# Patient Record
Sex: Male | Born: 1970 | Race: White | Hispanic: No | Marital: Married | Smoking: Never smoker
Health system: Southern US, Community
[De-identification: ages and names within clinical notes are randomized; demographics above are authoritative.]

---

## 1998-09-08 ENCOUNTER — Encounter: Admission: RE | Admit: 1998-09-08 | Discharge: 1998-09-08 | Payer: Self-pay | Admitting: Sports Medicine

## 1998-12-26 ENCOUNTER — Encounter: Admission: RE | Admit: 1998-12-26 | Discharge: 1998-12-26 | Payer: Self-pay | Admitting: Pediatrics

## 2009-10-07 DIAGNOSIS — R61 Generalized hyperhidrosis: Secondary | ICD-10-CM | POA: Insufficient documentation

## 2009-10-07 DIAGNOSIS — R509 Fever, unspecified: Secondary | ICD-10-CM

## 2009-10-17 ENCOUNTER — Encounter: Admission: RE | Admit: 2009-10-17 | Discharge: 2009-10-17 | Payer: Self-pay | Admitting: Internal Medicine

## 2009-10-21 ENCOUNTER — Encounter: Payer: Self-pay | Admitting: Infectious Diseases

## 2009-10-21 ENCOUNTER — Encounter (INDEPENDENT_AMBULATORY_CARE_PROVIDER_SITE_OTHER): Payer: Self-pay | Admitting: *Deleted

## 2009-10-21 DIAGNOSIS — E785 Hyperlipidemia, unspecified: Secondary | ICD-10-CM

## 2009-10-22 ENCOUNTER — Ambulatory Visit: Payer: Self-pay | Admitting: Infectious Diseases

## 2009-10-22 DIAGNOSIS — Z862 Personal history of diseases of the blood and blood-forming organs and certain disorders involving the immune mechanism: Secondary | ICD-10-CM

## 2009-10-22 DIAGNOSIS — Z8639 Personal history of other endocrine, nutritional and metabolic disease: Secondary | ICD-10-CM

## 2009-10-22 LAB — CONVERTED CEMR LAB
ALT: 209 units/L — ABNORMAL HIGH (ref 0–53)
AST: 144 units/L — ABNORMAL HIGH (ref 0–37)
Basophils Absolute: 0.1 10*3/uL (ref 0.0–0.1)
Basophils Relative: 1 % (ref 0–1)
CMV IgM: >240 — ABNORMAL HIGH (ref <30.0)
Calcium: 9.3 mg/dL (ref 8.4–10.5)
Creatinine, Ser: 0.94 mg/dL (ref 0.40–1.50)
Cytomegalovirus Ab-IgG: POSITIVE — AB
Eosinophils Relative: 4 % (ref 0–5)
HCT: 41.3 % (ref 39.0–52.0)
HIV-1 RNA Quant, Log: <1.68 (ref <1.68)
Hemoglobin: 14.5 g/dL (ref 13.0–17.0)
MCV: 95.4 fL (ref >=78.0)
Monocytes Absolute: 0.9 10*3/uL (ref 0.1–1.0)
Monocytes Relative: 13 % — ABNORMAL HIGH (ref 3–12)
Neutro Abs: 1.5 10*3/uL — ABNORMAL LOW (ref 1.7–7.7)
Neutrophils Relative %: 22 % — ABNORMAL LOW (ref 43–77)
Platelets: 172 10*3/uL (ref 150–400)
RDW: 12.9 % (ref 11.5–15.5)
Sed Rate: 14 mm/hr (ref 0–16)
Sodium: 138 meq/L (ref 135–145)
Total Bilirubin: 0.7 mg/dL (ref 0.3–1.2)
Total Protein: 7.3 g/dL (ref 6.0–8.3)
WBC: 6.7 10*3/uL (ref 4.0–10.5)

## 2009-10-23 ENCOUNTER — Telehealth: Payer: Self-pay | Admitting: Infectious Diseases

## 2009-11-06 ENCOUNTER — Ambulatory Visit: Payer: Self-pay | Admitting: Infectious Diseases

## 2009-11-06 LAB — CONVERTED CEMR LAB
Albumin: 4 g/dL (ref 3.5–5.2)
Bilirubin, Direct: 0.1 mg/dL (ref 0.0–0.3)
Indirect Bilirubin: 0.6 mg/dL (ref 0.0–0.9)
Total Bilirubin: 0.7 mg/dL (ref 0.3–1.2)

## 2010-10-27 NOTE — Progress Notes (Signed)
Summary: Lab results  Phone Note Outgoing Call   Summary of Call: Discussed lab results with pt.  Likely CMV mono. He will use symptomatic treatment and f/u in 2 weeks Initial call taken by: Clydie Braun MD,  October 23, 2009 8:25 PM

## 2010-10-27 NOTE — Consult Note (Signed)
Summary: [facility]  [facility]   Imported By: Florinda Marker 10/30/2009 15:21:06  _____________________________________________________________________  External Attachment:    Type:   Image     Comment:   External Document

## 2010-10-27 NOTE — Miscellaneous (Signed)
Summary: HIPAA Restrictions  HIPAA Restrictions   Imported By: Florinda Marker 10/22/2009 14:56:57  _____________________________________________________________________  External Attachment:    Type:   Image     Comment:   External Document

## 2010-10-27 NOTE — Assessment & Plan Note (Signed)
Summary: new pt feversx3wks   Visit Type:  Consult Primary Provider:  [name]  CC:  new patient / fevers x 3 weeks.  History of Present Illness: 41 yo wm with a month of fevers ns and mild fatigue (due to not sleeping).  3.5 wks ago felt poorly - thought it was the flu - took one day off at work (unusual for him) - saw Dr [name] and took zpack with no benefit (10/12/09 finished course). Has had night sweats - soaking and diff sleepig wiht fevers to 99-102 most nights.    Last week fevers have been subsiding.  Also some decrease in NS over last week. Also some sinus sxs but no diarrhea or vomited just mild nausea.  Stools normal.  No rash no joint or muscle aches.  no dysuria.  No abd pain. 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 Has had a runny nose but no other sxs.    Travelled to d/c jan 14th-16. International travel to [place] at a resort in the spring -  Lived in [place] 1998-99 and backpacked [place]. No recent vaccines except flu shot 6 wks ago.       Preventive Screening-Counseling & Management  Alcohol-Tobacco     Alcohol drinks/day: occassionally     Alcohol type: beer and wine     Smoking Status: never  Caffeine-Diet-Exercise     Caffeine use/day: coffee, tea and sodas     Does Patient Exercise: yes     Type of exercise: gym membership     Exercise (avg: min/session): 30-60     Times/week: 3-5  Safety-Violence-Falls     Seat Belt Use: yes   Updated Prior Medication List: CRESTOR 10 MG TABS (ROSUVASTATIN CALCIUM) Take 1 tablet by mouth once a day per PCP MULTIVITAMINS  TABS (MULTIPLE VITAMIN) Take 1 tablet by mouth once a day per PCP FISH OIL 1000 MG CAPS (OMEGA-3 FATTY ACIDS) Take 2 capsules by mouth once a day per PCP D 5000 5000 UNIT TABS (CHOLECALCIFEROL) Take 1 capsule by mouth once a day per PCP  Current Allergies (reviewed today): No known allergies  Past History:  Family History: Last updated: 10/22/2009 [place]  Social History: Last updated:  10/22/2009 no tobacco Occas alcohol Lives with wife and 2 yr old son - son in daycare. not sexually active   Risk Factors: Alcohol Use: occassionally (10/22/2009) Caffeine Use: coffee, tea and sodas (10/22/2009) Exercise: yes (10/22/2009)  Risk Factors: Smoking Status: never (10/22/2009)  Past Medical History: hypercholesterol   Family History: [place]  Social History: no tobacco Occas alcohol Lives with wife and 2 yr old son - son in daycare. not sexually active   Review of Systems       11 systems reviewed and negative except per HPI   Vital Signs:  Patient profile:   40 year old male Height:      75 inches (190.50 cm) Weight:      210.0 pounds (95.45 kg) BMI:     26.34 Temp:     97.6 degrees F (36.44 degrees C) oral Pulse rate:   82 / minute BP sitting:   112 / 72  (left arm)  Vitals Entered By: Baxter Hire) (October 22, 2009 9:33 AM) CC: new patient / fevers x 3 weeks Is Patient Diabetic? No Pain Assessment Patient in pain? no      Nutritional Status BMI of 25 - 29 = overweight Nutritional Status Detail appetite is good per patient  Does patient need  assistance? Functional Status Self care Ambulation Normal   Physical Exam  General:  alert, well-developed, and well-nourished.   Head:  normocephalic and atraumatic.   Eyes:  vision grossly intact and pupils equal.   Ears:  R ear normal and L ear normal.   Nose:  no external deformity.   Mouth:  good dentition.   Neck:  supple.   Lungs:  normal respiratory effort and normal breath sounds.   Heart:  normal rate, regular rhythm, and no murmur.   Abdomen:  soft, non-tender, normal bowel sounds, no distention, no hepatomegaly, and no splenomegaly.   Msk:  normal ROM and no joint tenderness.   Extremities:  no cce Neurologic:  alert & oriented X3, cranial nerves II-XII intact, strength normal in all extremities, and sensation intact to light touch.   Skin:  no rashes.   Cervical Nodes:  no  anterior cervical adenopathy and no posterior cervical adenopathy.   Psych:  Oriented X3, memory intact for recent and remote, and normally interactive.     Impression & Recommendations:  Problem # 1:  FEVER UNSPECIFIED (ICD-780.60) Interesting young man with FUO and elevated LFTs.  I suspect CMV infection given lfts and sxs and his 39 yr old in day care. Check BW below. f.u 2 wks and he will keep a symptom diary ibuprofen prn The following medications were removed from the medication list:    Zithromax 1 Gm Pack (Azithromycin) .Marland Kitchen... Per pcp  Orders: T-CBC w/Diff [id number]) T-CMV IgM  Antibody ([id number]) T-CMV IgG Antibody ([id number]) T-Comprehensive Metabolic Panel ([id number]) T-Culture, Blood Routine ([id number]) T-Culture, Blood Routine ([id number]) T-Hepatitis A Antibody ([id number]) T-Hepatitis C Antibody ([id number]) T-Hepatitis B Core Antibody ([id number]) T-Hepatitis B Surface Antigen (60737-10626) T-HIV Antibody  (Reflex) ([id number]) T-HIV Viral Load [phone]) T-RPR (Syphilis) (93716-96789) T-Sed Rate (Automated) ([id number]) T-CK Total (58527-78242)  Problem # 2:  LIVER FUNCTION TESTS, ABNORMAL, HX OF (ICD-V12.2) will check hep serologies and repeat  Patient Instructions: 1)  Please schedule a follow-up appointment in 2 weeks (can overbook) 2)  Keep a fever and symptom diary. 3)  Take ibuprofen as needed. 4)  Call for new or concerning symptoms. 5)  I will call if your blood tests are very abnormal Process Orders Check Orders Results:     [facility]: ABN not required for this insurance Tests Sent for requisitioning (October 22, 2009 8:49 PM):     10/22/2009: [facility] -- T-CBC w/Diff [[id number]] (signed)     10/22/2009: [facility] -- T-CMV IgM  Antibody [[id number]] (signed)     10/22/2009: [facility] -- T-CMV IgG Antibody [[id number]] (signed)     10/22/2009: [facility] -- T-Comprehensive Metabolic Panel [[id number]] (signed)     10/22/2009: [facility] -- T-Culture, Blood Routine [[id number]] (signed)     10/22/2009: [facility] -- T-Culture, Blood Routine [[id number]] (signed)     10/22/2009: [facility] -- T-Hepatitis A Antibody [[id number]] (signed)     10/22/2009: [facility] -- T-Hepatitis C Antibody [[id number]] (signed)     10/22/2009: [facility] -- T-Hepatitis B Core Antibody [[id number]] (signed)     10/22/2009: [facility] -- T-Hepatitis B Surface Antigen [[id number]] (signed)     10/22/2009: [facility] -- T-HIV Antibody  (Reflex) [[id number]] (signed)     10/22/2009: [facility] -- T-HIV Viral Load [phone] (signed)     10/22/2009: [facility] -- T-RPR (Syphilis) [[id number]] (signed)     10/22/2009: [facility] --  T-Sed Rate (Automated) [[id number]] (signed)     10/22/2009: [facility] -- T-CK Total [[id number]] (signed)

## 2010-10-27 NOTE — Miscellaneous (Signed)
Summary: Problems, Medications and Allergies   Clinical Lists Changes  Problems: Added new problem of NIGHT SWEATS (ICD-780.8) Added new problem of FEVER UNSPECIFIED (ICD-780.60) Added new problem of HYPERLIPIDEMIA (ICD-272.4) Medications: Added new medication of CRESTOR 10 MG TABS (ROSUVASTATIN CALCIUM) Take 1 tablet by mouth once a day per PCP Added new medication of MULTIVITAMINS  TABS (MULTIPLE VITAMIN) Take 1 tablet by mouth once a day per PCP Added new medication of FISH OIL 1000 MG CAPS (OMEGA-3 FATTY ACIDS) Take 2 capsules by mouth once a day per PCP Added new medication of D 5000 5000 UNIT TABS (CHOLECALCIFEROL) Take 1 capsule by mouth once a day per PCP Added new medication of ZITHROMAX 1 GM PACK (AZITHROMYCIN) per PCP Observations: Added new observation of TB PPDRESULT: negative (10/21/2009 13:23) Added new observation of PPD RESULT: < 5mm (10/21/2009 13:23) Added new observation of TB-PPD RDDTE: 10/17/2009 (10/21/2009 13:23) Added new observation of NKA: T (10/21/2009 13:23)      PPD Results    Date of reading: 10/17/2009    Results: < 5mm    Interpretation: negative

## 2010-10-27 NOTE — Assessment & Plan Note (Signed)
Summary: 2WK F/U/VS   Visit Type:  Follow-up Primary Provider:  [name]  CC:  2 week follow up.  History of Present Illness: 40 yo wm with a month of fevers ns and mild fatigue (due to not sleeping).  SEEN 2 wks and bw suggested CMV mono. He is feeling much better now with no further fevers chills ns and back to nml energy level   PRIOR 3.5 wks ago felt poorly - thought it was the flu - took one day off at work (unusual for him) - saw Dr [name] and took zpack with no benefit (10/12/09 finished course). Has had night sweats - soaking and diff sleepig wiht fevers to 99-102 most nights.    Last week fevers have been subsiding.  Also some decrease in NS over last week. Also some sinus sxs but no diarrhea or vomited just mild nausea.  Stools normal.  No rash no joint or muscle aches.  no dysuria.  No abd pain. 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 Has had a runny nose but no other sxs.    Travelled to d/c jan 14th-16. International travel to [place] at a resort in the spring -  Lived in [place] 1998-99 and backpacked [place]. No recent vaccines except flu shot 6 wks ago.       Preventive Screening-Counseling & Management  Alcohol-Tobacco     Alcohol drinks/day: occassionally     Alcohol type: beer and wine     Smoking Status: never  Caffeine-Diet-Exercise     Caffeine use/day: coffee, tea and sodas     Does Patient Exercise: yes     Type of exercise: gym membership     Exercise (avg: min/session): 30-60     Times/week: 3-5  Safety-Violence-Falls     Seat Belt Use: yes   Updated Prior Medication List: CRESTOR 10 MG TABS (ROSUVASTATIN CALCIUM) Take 1 tablet by mouth once a day per PCP MULTIVITAMINS  TABS (MULTIPLE VITAMIN) Take 1 tablet by mouth once a day per PCP FISH OIL 1000 MG CAPS (OMEGA-3 FATTY ACIDS) Take 2 capsules by mouth once a day per PCP D 5000 5000 UNIT TABS (CHOLECALCIFEROL) Take 1 capsule by mouth once a day per PCP  Current Allergies (reviewed  today): No known allergies  Past History:  Past Medical History: Last updated: 10/22/2009 hypercholesterol   Family History: Last updated: 10/22/2009 [place]  Social History: Last updated: 10/22/2009 no tobacco Occas alcohol Lives with wife and 2 yr old son - son in daycare. not sexually active   Risk Factors: Alcohol Use: occassionally (11/06/2009) Caffeine Use: coffee, tea and sodas (11/06/2009) Exercise: yes (11/06/2009)  Risk Factors: Smoking Status: never (11/06/2009)  Review of Systems       11 systems reviewed and negative except per HPI   Vital Signs:  Patient profile:   40 year old male Height:      75 inches (190.50 cm) Weight:      208.5 pounds (94.77 kg) BMI:     26.15 Temp:     97.4 degrees F (36.33 degrees C) oral Pulse rate:   76 / minute BP sitting:   121 / 72  (left arm)  Vitals Entered By: Baxter Hire) (November 06, 2009 9:05 AM) CC: 2 week follow up Is Patient Diabetic? No Pain Assessment Patient in pain? no      Nutritional Status BMI of 25 - 29 = overweight Nutritional Status Detail appetite is good per patient  Does patient need assistance? Functional  Status Self care Ambulation Normal Comments per patient has stopped taking meds at this time.   Physical Exam  General:  alert and well-nourished.   Head:  normocephalic and atraumatic.   Eyes:  vision grossly intact and pupils equal.   Mouth:  fair dentition.   Neck:  supple.   Lungs:  normal respiratory effort and normal breath sounds.   Heart:  normal rate and regular rhythm.   Abdomen:  soft, non-tender, and normal bowel sounds.   Msk:  normal ROM and no joint tenderness.   Extremities:  no cce Neurologic:  alert & oriented X3 and cranial nerves II-XII intact.      Impression & Recommendations:  Problem # 1:  FEVER UNSPECIFIED (ICD-780.60) Interesting young man with FUO and elevated LFTs.  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  CMV IGM was sky high.  He has resolved his illness.   Will repeat LFTS now to confirm resolution of transaminiitis Follow up as needed.  Diagnostics Reviewed:  Hgb: 14.5 (10/22/2009)   HCT: 41.3 (10/22/2009)   Platelets: 172 (10/22/2009) RBC: 4.33 (10/22/2009)   MCV: 95.4 (10/22/2009)   MCHC: 35.1 (10/22/2009) WBC: 6.7 (10/22/2009)   CSF Results Glucose: 85 (10/22/2009) RPR: NON REAC (10/22/2009)     Problem # 2:  LIVER FUNCTION TESTS, ABNORMAL, HX OF (ICD-V12.2)   hep serologies neg except hep a and history of prior immunization  Problem # 3:  NIGHT SWEATS (ICD-780.8) Assessment: Improved  Other Orders: T-Hepatic Function ([id number]) Est. Patient Level IV (09811)  Patient Instructions: 1)  Follow up as needed for recurrent fevers night sweats weight loss or other concerning symptoms.

## 2011-05-02 ENCOUNTER — Emergency Department (HOSPITAL_COMMUNITY)
Admission: EM | Admit: 2011-05-02 | Discharge: 2011-05-02 | Disposition: A | Discharge status: Home / Self Care | Payer: Private Health Insurance - Indemnity | Attending: Emergency Medicine | Admitting: Emergency Medicine

## 2011-05-02 DIAGNOSIS — H11419 Vascular abnormalities of conjunctiva, unspecified eye: Secondary | ICD-10-CM | POA: Insufficient documentation

## 2011-05-02 DIAGNOSIS — E789 Disorder of lipoprotein metabolism, unspecified: Secondary | ICD-10-CM | POA: Insufficient documentation

## 2011-05-02 DIAGNOSIS — H1089 Other conjunctivitis: Secondary | ICD-10-CM | POA: Insufficient documentation

## 2011-05-02 DIAGNOSIS — H5789 Other specified disorders of eye and adnexa: Secondary | ICD-10-CM | POA: Insufficient documentation

## 2014-11-07 ENCOUNTER — Other Ambulatory Visit: Payer: Self-pay | Admitting: Internal Medicine

## 2014-11-07 DIAGNOSIS — IMO0002 Reserved for concepts with insufficient information to code with codable children: Secondary | ICD-10-CM

## 2014-11-07 DIAGNOSIS — R740 Nonspecific elevation of levels of transaminase and lactic acid dehydrogenase [LDH]: Principal | ICD-10-CM

## 2014-11-18 ENCOUNTER — Ambulatory Visit
Admission: RE | Admit: 2014-11-18 | Discharge: 2014-11-18 | Disposition: A | Discharge status: Home / Self Care | Payer: Private Health Insurance - Indemnity | Source: Ambulatory Visit | Attending: Internal Medicine | Admitting: Internal Medicine

## 2014-11-18 DIAGNOSIS — IMO0002 Reserved for concepts with insufficient information to code with codable children: Secondary | ICD-10-CM

## 2014-11-18 DIAGNOSIS — R740 Nonspecific elevation of levels of transaminase and lactic acid dehydrogenase [LDH]: Principal | ICD-10-CM

## 2014-11-19 ENCOUNTER — Encounter: Payer: Self-pay | Admitting: Podiatry

## 2014-11-19 ENCOUNTER — Ambulatory Visit: Payer: Self-pay

## 2014-11-19 ENCOUNTER — Ambulatory Visit (INDEPENDENT_AMBULATORY_CARE_PROVIDER_SITE_OTHER): Payer: Managed Care, Other (non HMO) | Admitting: Podiatry

## 2014-11-19 VITALS — BP 119/82 | HR 63 | Resp 16

## 2014-11-19 DIAGNOSIS — M898X9 Other specified disorders of bone, unspecified site: Secondary | ICD-10-CM

## 2014-11-19 DIAGNOSIS — L84 Corns and callosities: Secondary | ICD-10-CM

## 2014-11-19 DIAGNOSIS — M779 Enthesopathy, unspecified: Secondary | ICD-10-CM

## 2014-11-19 MED ORDER — TRIAMCINOLONE ACETONIDE 10 MG/ML IJ SUSP
10.0000 mg | Freq: Once | INTRAMUSCULAR | Status: AC
  Administered 2014-11-19: 10 mg

## 2014-11-19 NOTE — Progress Notes (Signed)
Subjective:     Patient ID: Arthur Guerrero, male   DOB: 1971/09/21, 44 y.o.   MRN: [id number]  HPI patient presents stating he's been getting some pain in his left big toe of around a month duration. He is due to go on a big hike this summer and is concerned as he increases mileage whether or not this will be a problem   Review of Systems  All other systems reviewed and are negative.      Objective:   Physical Exam  Constitutional: He is oriented to person, place, and time.  Cardiovascular: Intact distal pulses.   Musculoskeletal: Normal range of motion.  Neurological: He is oriented to person, place, and time.  Skin: Skin is warm.  Nursing note and vitals reviewed.  neurovascular status intact with muscle strength adequate and range of motion subtalar midtarsal joint within normal limits. Patient's noted to have deformity of the second toes both feet with medial rotation of the toes and compression between the second toe and big toe of both feet. There is an inflammatory area on the big toe left over right with fluid buildup at the interphalangeal joint left hallux and keratotic lesion formation     Assessment:     Deformed second toe with digital type disease causing inflammation of the hallux left over right and fluid buildup at the interphalangeal joint    Plan:     H&P and x-rays reviewed with patient. Injected the interphalangeal joint left hallux 3 mg Dexon some Kenalog 5 mg Xylocaine and applied padding to reduce pressure. Discussed someday this may require surgical intervention

## 2014-11-19 NOTE — Progress Notes (Signed)
   Subjective:    Patient ID: Arthur Guerrero, male    DOB: 1970/12/03, 44 y.o.   MRN: [id number]  HPI Comments: "I have a place on my toe"  Patient c/o tender 1st toe left for a couple weeks. There is a callused area. The 2nd toe is rubbing against it. No home treatment.  Going on a big hike this summer and wants feet in good shape!  Toe Pain       Review of Systems  All other systems reviewed and are negative.      Objective:   Physical Exam        Assessment & Plan:

## 2015-06-04 ENCOUNTER — Ambulatory Visit: Payer: Managed Care, Other (non HMO) | Admitting: Podiatry

## 2015-06-04 ENCOUNTER — Encounter: Payer: Self-pay | Admitting: Podiatry

## 2015-06-04 ENCOUNTER — Ambulatory Visit (INDEPENDENT_AMBULATORY_CARE_PROVIDER_SITE_OTHER): Payer: Managed Care, Other (non HMO)

## 2015-06-04 VITALS — BP 99/65 | HR 70 | Resp 16

## 2015-06-04 DIAGNOSIS — M79671 Pain in right foot: Secondary | ICD-10-CM

## 2015-06-04 DIAGNOSIS — M79672 Pain in left foot: Principal | ICD-10-CM

## 2015-06-04 NOTE — Progress Notes (Signed)
Subjective:     Patient ID: Arthur Guerrero, male   DOB: 1970/11/29, 44 y.o.   MRN: [id number]  HPI patient states I have a lot of pain on the side of my right foot and also I have pain on top of my left foot. Patient states that he's been very active and walked approximately 100 miles   Review of Systems     Objective:   Physical Exam Neurovascular status intact muscle strength adequate with discomfort around the fifth metatarsal head right with fluid buildup and discomfort in the top of the left foot around the anterior tibial and extensor hallucis longus as it crosses over the ankle joint with no indication of tendon dysfunction    Assessment:     Inflammatory capsulitis right and dorsal tendinitis left    Plan:     Reviewed x-rays and discussed condition. Injected the capsule of the fifth MPJ right 3 mg dexamethasone Kenalog 5 mg Xylocaine and the dorsum of the left foot around the anterior tibial extensor tendon complex 3 mg Kenalog 5 mg Xylocaine

## 2016-05-11 DIAGNOSIS — Z0001 Encounter for general adult medical examination with abnormal findings: Secondary | ICD-10-CM | POA: Diagnosis not present

## 2016-05-11 DIAGNOSIS — Z125 Encounter for screening for malignant neoplasm of prostate: Secondary | ICD-10-CM | POA: Diagnosis not present

## 2016-05-11 DIAGNOSIS — E559 Vitamin D deficiency, unspecified: Secondary | ICD-10-CM | POA: Diagnosis not present

## 2016-05-19 DIAGNOSIS — E785 Hyperlipidemia, unspecified: Secondary | ICD-10-CM | POA: Diagnosis not present

## 2016-05-19 DIAGNOSIS — Z23 Encounter for immunization: Secondary | ICD-10-CM | POA: Diagnosis not present

## 2016-05-19 DIAGNOSIS — Z0001 Encounter for general adult medical examination with abnormal findings: Secondary | ICD-10-CM | POA: Diagnosis not present

## 2016-05-19 DIAGNOSIS — E559 Vitamin D deficiency, unspecified: Secondary | ICD-10-CM | POA: Diagnosis not present

## 2016-06-24 DIAGNOSIS — M67911 Unspecified disorder of synovium and tendon, right shoulder: Secondary | ICD-10-CM | POA: Diagnosis not present

## 2016-07-22 DIAGNOSIS — M67911 Unspecified disorder of synovium and tendon, right shoulder: Secondary | ICD-10-CM | POA: Diagnosis not present

## 2016-07-28 DIAGNOSIS — M25511 Pain in right shoulder: Secondary | ICD-10-CM | POA: Diagnosis not present

## 2016-08-03 DIAGNOSIS — M75111 Incomplete rotator cuff tear or rupture of right shoulder, not specified as traumatic: Secondary | ICD-10-CM | POA: Diagnosis not present

## 2016-08-03 DIAGNOSIS — M67911 Unspecified disorder of synovium and tendon, right shoulder: Secondary | ICD-10-CM | POA: Diagnosis not present

## 2016-09-02 DIAGNOSIS — M67911 Unspecified disorder of synovium and tendon, right shoulder: Secondary | ICD-10-CM | POA: Diagnosis not present

## 2016-11-12 DIAGNOSIS — E559 Vitamin D deficiency, unspecified: Secondary | ICD-10-CM | POA: Diagnosis not present

## 2016-11-12 DIAGNOSIS — E785 Hyperlipidemia, unspecified: Secondary | ICD-10-CM | POA: Diagnosis not present

## 2016-11-19 DIAGNOSIS — E785 Hyperlipidemia, unspecified: Secondary | ICD-10-CM | POA: Diagnosis not present

## 2016-11-19 DIAGNOSIS — Z0001 Encounter for general adult medical examination with abnormal findings: Secondary | ICD-10-CM | POA: Diagnosis not present

## 2016-11-19 DIAGNOSIS — E559 Vitamin D deficiency, unspecified: Secondary | ICD-10-CM | POA: Diagnosis not present

## 2017-03-17 DIAGNOSIS — L237 Allergic contact dermatitis due to plants, except food: Secondary | ICD-10-CM | POA: Diagnosis not present

## 2017-06-22 DIAGNOSIS — Z0001 Encounter for general adult medical examination with abnormal findings: Secondary | ICD-10-CM | POA: Diagnosis not present

## 2017-06-22 DIAGNOSIS — E785 Hyperlipidemia, unspecified: Secondary | ICD-10-CM | POA: Diagnosis not present

## 2017-06-22 DIAGNOSIS — E559 Vitamin D deficiency, unspecified: Secondary | ICD-10-CM | POA: Diagnosis not present

## 2017-06-22 DIAGNOSIS — Z125 Encounter for screening for malignant neoplasm of prostate: Secondary | ICD-10-CM | POA: Diagnosis not present

## 2017-06-30 DIAGNOSIS — Z0000 Encounter for general adult medical examination without abnormal findings: Secondary | ICD-10-CM | POA: Diagnosis not present

## 2017-06-30 DIAGNOSIS — S76012A Strain of muscle, fascia and tendon of left hip, initial encounter: Secondary | ICD-10-CM | POA: Diagnosis not present

## 2017-06-30 DIAGNOSIS — E559 Vitamin D deficiency, unspecified: Secondary | ICD-10-CM | POA: Diagnosis not present

## 2017-06-30 DIAGNOSIS — E7800 Pure hypercholesterolemia, unspecified: Secondary | ICD-10-CM | POA: Diagnosis not present

## 2017-07-05 DIAGNOSIS — M25552 Pain in left hip: Secondary | ICD-10-CM | POA: Diagnosis not present

## 2017-07-06 ENCOUNTER — Ambulatory Visit: Payer: Managed Care, Other (non HMO) | Admitting: Family Medicine

## 2017-11-01 DIAGNOSIS — L128 Other pemphigoid: Secondary | ICD-10-CM | POA: Diagnosis not present

## 2017-11-08 DIAGNOSIS — L128 Other pemphigoid: Secondary | ICD-10-CM | POA: Diagnosis not present

## 2017-11-14 DIAGNOSIS — L128 Other pemphigoid: Secondary | ICD-10-CM | POA: Diagnosis not present

## 2017-12-29 DIAGNOSIS — E7800 Pure hypercholesterolemia, unspecified: Secondary | ICD-10-CM | POA: Diagnosis not present

## 2018-03-18 DIAGNOSIS — M25562 Pain in left knee: Secondary | ICD-10-CM | POA: Diagnosis not present

## 2018-07-03 DIAGNOSIS — Z0000 Encounter for general adult medical examination without abnormal findings: Secondary | ICD-10-CM | POA: Diagnosis not present

## 2018-07-03 DIAGNOSIS — Z125 Encounter for screening for malignant neoplasm of prostate: Secondary | ICD-10-CM | POA: Diagnosis not present

## 2018-07-03 DIAGNOSIS — E559 Vitamin D deficiency, unspecified: Secondary | ICD-10-CM | POA: Diagnosis not present

## 2018-07-06 DIAGNOSIS — Z0000 Encounter for general adult medical examination without abnormal findings: Secondary | ICD-10-CM | POA: Diagnosis not present

## 2018-07-06 DIAGNOSIS — E559 Vitamin D deficiency, unspecified: Secondary | ICD-10-CM | POA: Diagnosis not present

## 2018-07-06 DIAGNOSIS — E785 Hyperlipidemia, unspecified: Secondary | ICD-10-CM | POA: Diagnosis not present

## 2018-07-28 DIAGNOSIS — H35713 Central serous chorioretinopathy, bilateral: Secondary | ICD-10-CM | POA: Diagnosis not present

## 2018-11-30 DIAGNOSIS — J069 Acute upper respiratory infection, unspecified: Secondary | ICD-10-CM | POA: Diagnosis not present

## 2019-07-17 DIAGNOSIS — Z0001 Encounter for general adult medical examination with abnormal findings: Secondary | ICD-10-CM | POA: Diagnosis not present

## 2019-07-20 DIAGNOSIS — E785 Hyperlipidemia, unspecified: Secondary | ICD-10-CM | POA: Diagnosis not present

## 2019-07-20 DIAGNOSIS — Z23 Encounter for immunization: Secondary | ICD-10-CM | POA: Diagnosis not present

## 2019-07-20 DIAGNOSIS — Z0000 Encounter for general adult medical examination without abnormal findings: Secondary | ICD-10-CM | POA: Diagnosis not present

## 2019-07-30 ENCOUNTER — Other Ambulatory Visit: Payer: Self-pay | Admitting: Internal Medicine

## 2019-08-02 DIAGNOSIS — D485 Neoplasm of uncertain behavior of skin: Secondary | ICD-10-CM | POA: Diagnosis not present

## 2019-08-02 DIAGNOSIS — D1801 Hemangioma of skin and subcutaneous tissue: Secondary | ICD-10-CM | POA: Diagnosis not present

## 2019-08-02 DIAGNOSIS — L814 Other melanin hyperpigmentation: Secondary | ICD-10-CM | POA: Diagnosis not present

## 2019-08-02 DIAGNOSIS — D229 Melanocytic nevi, unspecified: Secondary | ICD-10-CM | POA: Diagnosis not present

## 2019-08-02 DIAGNOSIS — L819 Disorder of pigmentation, unspecified: Secondary | ICD-10-CM | POA: Diagnosis not present

## 2019-08-03 DIAGNOSIS — D225 Melanocytic nevi of trunk: Secondary | ICD-10-CM | POA: Diagnosis not present

## 2019-10-04 DIAGNOSIS — H35713 Central serous chorioretinopathy, bilateral: Secondary | ICD-10-CM | POA: Diagnosis not present

## 2020-01-08 DIAGNOSIS — Z0000 Encounter for general adult medical examination without abnormal findings: Secondary | ICD-10-CM | POA: Diagnosis not present

## 2020-01-11 DIAGNOSIS — E785 Hyperlipidemia, unspecified: Secondary | ICD-10-CM | POA: Diagnosis not present

## 2020-03-18 DIAGNOSIS — R1310 Dysphagia, unspecified: Secondary | ICD-10-CM | POA: Diagnosis not present

## 2020-04-26 ENCOUNTER — Encounter (HOSPITAL_COMMUNITY): Payer: Self-pay

## 2020-04-26 ENCOUNTER — Emergency Department (HOSPITAL_COMMUNITY)
Admission: EM | Admit: 2020-04-26 | Discharge: 2020-04-26 | Disposition: A | Discharge status: Home / Self Care | Payer: No Typology Code available for payment source | Attending: Orthopedic Surgery | Admitting: Orthopedic Surgery

## 2020-04-26 ENCOUNTER — Emergency Department (HOSPITAL_COMMUNITY): Payer: No Typology Code available for payment source | Admitting: Anesthesiology

## 2020-04-26 ENCOUNTER — Other Ambulatory Visit: Payer: Self-pay

## 2020-04-26 ENCOUNTER — Emergency Department (HOSPITAL_COMMUNITY): Payer: No Typology Code available for payment source

## 2020-04-26 ENCOUNTER — Encounter (HOSPITAL_COMMUNITY)
Admission: EM | Disposition: A | Discharge status: Home / Self Care | Payer: Self-pay | Source: Home / Self Care | Attending: Emergency Medicine

## 2020-04-26 DIAGNOSIS — E785 Hyperlipidemia, unspecified: Secondary | ICD-10-CM | POA: Diagnosis not present

## 2020-04-26 DIAGNOSIS — Y939 Activity, unspecified: Secondary | ICD-10-CM | POA: Diagnosis not present

## 2020-04-26 DIAGNOSIS — S52572A Other intraarticular fracture of lower end of left radius, initial encounter for closed fracture: Secondary | ICD-10-CM | POA: Diagnosis not present

## 2020-04-26 DIAGNOSIS — Z20822 Contact with and (suspected) exposure to covid-19: Secondary | ICD-10-CM | POA: Diagnosis not present

## 2020-04-26 DIAGNOSIS — Z79899 Other long term (current) drug therapy: Secondary | ICD-10-CM | POA: Diagnosis not present

## 2020-04-26 DIAGNOSIS — G8918 Other acute postprocedural pain: Secondary | ICD-10-CM | POA: Diagnosis not present

## 2020-04-26 DIAGNOSIS — W07XXXA Fall from chair, initial encounter: Secondary | ICD-10-CM | POA: Diagnosis not present

## 2020-04-26 DIAGNOSIS — S6992XA Unspecified injury of left wrist, hand and finger(s), initial encounter: Secondary | ICD-10-CM | POA: Diagnosis present

## 2020-04-26 HISTORY — PX: OPEN REDUCTION INTERNAL FIXATION (ORIF) DISTAL RADIAL FRACTURE: SHX5989

## 2020-04-26 LAB — COMPREHENSIVE METABOLIC PANEL
ALT: 24 U/L (ref 0–44)
AST: 26 U/L (ref 15–41)
Albumin: 3.7 g/dL (ref 3.5–5.0)
Alkaline Phosphatase: 86 U/L (ref 38–126)
Anion gap: 8 (ref 5–15)
BUN: 10 mg/dL (ref 6–20)
CO2: 24 mmol/L (ref 22–32)
Calcium: 9.5 mg/dL (ref 8.9–10.3)
Chloride: 105 mmol/L (ref 98–111)
Creatinine, Ser: 0.92 mg/dL (ref 0.61–1.24)
GFR calc Af Amer: >60 mL/min (ref >60)
GFR calc non Af Amer: >60 mL/min (ref >60)
Glucose, Bld: 108 mg/dL — ABNORMAL HIGH (ref 70–99)
Potassium: 3.7 mmol/L (ref 3.5–5.1)
Sodium: 137 mmol/L (ref 135–145)
Total Bilirubin: 0.6 mg/dL (ref 0.3–1.2)
Total Protein: 7.5 g/dL (ref 6.5–8.1)

## 2020-04-26 LAB — SARS CORONAVIRUS 2 BY RT PCR (HOSPITAL ORDER, PERFORMED IN ~~LOC~~ HOSPITAL LAB): SARS Coronavirus 2: NEGATIVE

## 2020-04-26 LAB — CBC WITH DIFFERENTIAL/PLATELET
Abs Immature Granulocytes: 0.03 10*3/uL (ref 0.00–0.07)
Basophils Absolute: 0 10*3/uL (ref 0.0–0.1)
Basophils Relative: 0 %
Eosinophils Absolute: 0 10*3/uL (ref 0.0–0.5)
Eosinophils Relative: 0 %
HCT: 45.4 % (ref 39.0–52.0)
Hemoglobin: 15 g/dL (ref 13.0–17.0)
Immature Granulocytes: 0 %
Lymphocytes Relative: 9 %
Lymphs Abs: 0.8 10*3/uL (ref 0.7–4.0)
MCH: 30.5 pg (ref 26.0–34.0)
MCHC: 33 g/dL (ref 30.0–36.0)
MCV: 92.5 fL (ref 80.0–100.0)
Monocytes Absolute: 0.4 10*3/uL (ref 0.1–1.0)
Monocytes Relative: 5 %
Neutro Abs: 7 10*3/uL (ref 1.7–7.7)
Neutrophils Relative %: 86 %
Platelets: 198 10*3/uL (ref 150–400)
RBC: 4.91 MIL/uL (ref 4.22–5.81)
RDW: 11.5 % (ref 11.5–15.5)
WBC: 8.2 10*3/uL (ref 4.0–10.5)
nRBC: 0 % (ref 0.0–0.2)

## 2020-04-26 SURGERY — OPEN REDUCTION INTERNAL FIXATION (ORIF) DISTAL RADIUS FRACTURE
Anesthesia: General | Site: Wrist | Laterality: Left

## 2020-04-26 MED ORDER — METHOCARBAMOL 500 MG PO TABS: 500.0000 mg | ORAL_TABLET | Freq: Four times a day (QID) | ORAL | 0 refills | Status: DC

## 2020-04-26 MED ORDER — DEXAMETHASONE SODIUM PHOSPHATE 10 MG/ML IJ SOLN
INTRAMUSCULAR | Status: DC | PRN
  Administered 2020-04-26: 10 mg via INTRAVENOUS

## 2020-04-26 MED ORDER — ACETAMINOPHEN 500 MG PO TABS: 1000.0000 mg | ORAL_TABLET | Freq: Once | ORAL | Status: AC

## 2020-04-26 MED ORDER — MIDAZOLAM HCL 2 MG/2ML IJ SOLN
INTRAMUSCULAR | Status: AC
  Filled 2020-04-26: qty 2

## 2020-04-26 MED ORDER — IBUPROFEN 400 MG PO TABS
400.0000 mg | ORAL_TABLET | Freq: Once | ORAL | Status: AC
  Administered 2020-04-26: 400 mg via ORAL
  Filled 2020-04-26: qty 1

## 2020-04-26 MED ORDER — CEFAZOLIN SODIUM-DEXTROSE 2-4 GM/100ML-% IV SOLN
INTRAVENOUS | Status: AC
  Filled 2020-04-26: qty 100

## 2020-04-26 MED ORDER — ORAL CARE MOUTH RINSE: 15.0000 mL | Freq: Once | OROMUCOSAL | Status: AC

## 2020-04-26 MED ORDER — CLONIDINE HCL (ANALGESIA) 100 MCG/ML EP SOLN
EPIDURAL | Status: DC | PRN
  Administered 2020-04-26: 50 ug

## 2020-04-26 MED ORDER — CEFAZOLIN SODIUM-DEXTROSE 2-3 GM-%(50ML) IV SOLR
INTRAVENOUS | Status: DC | PRN
Start: 2020-04-26 — End: 2020-04-26
  Administered 2020-04-26: 2 g via INTRAVENOUS

## 2020-04-26 MED ORDER — PROPOFOL 10 MG/ML IV BOLUS
INTRAVENOUS | Status: DC | PRN
  Administered 2020-04-26: 200 mg via INTRAVENOUS

## 2020-04-26 MED ORDER — OXYCODONE HCL 5 MG PO TABS: 5.0000 mg | ORAL_TABLET | ORAL | 0 refills | Status: AC | PRN

## 2020-04-26 MED ORDER — LACTATED RINGERS IV SOLN: INTRAVENOUS | Status: DC

## 2020-04-26 MED ORDER — CHLORHEXIDINE GLUCONATE 0.12 % MT SOLN: 15.0000 mL | Freq: Once | OROMUCOSAL | Status: AC

## 2020-04-26 MED ORDER — FENTANYL CITRATE (PF) 100 MCG/2ML IJ SOLN
INTRAMUSCULAR | Status: DC | PRN
  Administered 2020-04-26: 100 ug via INTRAVENOUS

## 2020-04-26 MED ORDER — MIDAZOLAM HCL 5 MG/5ML IJ SOLN
INTRAMUSCULAR | Status: DC | PRN
  Administered 2020-04-26: 2 mg via INTRAVENOUS

## 2020-04-26 MED ORDER — AMISULPRIDE (ANTIEMETIC) 5 MG/2ML IV SOLN: 10.0000 mg | Freq: Once | INTRAVENOUS | Status: DC | PRN

## 2020-04-26 MED ORDER — PROPOFOL 10 MG/ML IV BOLUS
INTRAVENOUS | Status: AC
  Filled 2020-04-26: qty 20

## 2020-04-26 MED ORDER — ROPIVACAINE HCL 5 MG/ML IJ SOLN
INTRAMUSCULAR | Status: DC | PRN
Start: 2020-04-26 — End: 2020-04-26
  Administered 2020-04-26: 30 mL via PERINEURAL

## 2020-04-26 MED ORDER — FENTANYL CITRATE (PF) 100 MCG/2ML IJ SOLN: 25.0000 ug | INTRAMUSCULAR | Status: DC | PRN

## 2020-04-26 MED ORDER — LIDOCAINE 2% (20 MG/ML) 5 ML SYRINGE
INTRAMUSCULAR | Status: DC | PRN
  Administered 2020-04-26: 20 mg via INTRAVENOUS

## 2020-04-26 MED ORDER — CEPHALEXIN 500 MG PO CAPS: 500.0000 mg | ORAL_CAPSULE | Freq: Four times a day (QID) | ORAL | 0 refills | Status: AC

## 2020-04-26 MED ORDER — ACETAMINOPHEN 500 MG PO TABS
ORAL_TABLET | ORAL | Status: AC
  Administered 2020-04-26: 1000 mg via ORAL
  Filled 2020-04-26: qty 2

## 2020-04-26 MED ORDER — ONDANSETRON HCL 4 MG/2ML IJ SOLN
INTRAMUSCULAR | Status: DC | PRN
  Administered 2020-04-26: 4 mg via INTRAVENOUS

## 2020-04-26 MED ORDER — CELECOXIB 200 MG PO CAPS: 200.0000 mg | ORAL_CAPSULE | Freq: Once | ORAL | Status: AC

## 2020-04-26 MED ORDER — ONDANSETRON HCL 4 MG PO TABS: 4.0000 mg | ORAL_TABLET | Freq: Every day | ORAL | 1 refills | Status: AC | PRN

## 2020-04-26 MED ORDER — FENTANYL CITRATE (PF) 250 MCG/5ML IJ SOLN
INTRAMUSCULAR | Status: AC
  Filled 2020-04-26: qty 5

## 2020-04-26 MED ORDER — CHLORHEXIDINE GLUCONATE 0.12 % MT SOLN
OROMUCOSAL | Status: AC
  Administered 2020-04-26: 15 mL via OROMUCOSAL
  Filled 2020-04-26: qty 15

## 2020-04-26 MED ORDER — CELECOXIB 200 MG PO CAPS
ORAL_CAPSULE | ORAL | Status: AC
  Administered 2020-04-26: 200 mg via ORAL
  Filled 2020-04-26: qty 1

## 2020-04-26 MED ORDER — LIDOCAINE-EPINEPHRINE (PF) 2 %-1:200000 IJ SOLN
10.0000 mL | Freq: Once | INTRAMUSCULAR | Status: AC
  Administered 2020-04-26: 10 mL
  Filled 2020-04-26: qty 10

## 2020-04-26 MED ORDER — 0.9 % SODIUM CHLORIDE (POUR BTL) OPTIME
TOPICAL | Status: DC | PRN
  Administered 2020-04-26: 1000 mL

## 2020-04-26 MED ORDER — EPHEDRINE SULFATE 50 MG/ML IJ SOLN
INTRAMUSCULAR | Status: DC | PRN
Start: 2020-04-26 — End: 2020-04-26
  Administered 2020-04-26 (×2): 10 mg via INTRAVENOUS

## 2020-04-26 SURGICAL SUPPLY — 44 items
BIT DRILL 2.2 SS TIBIAL (BIT) ×3 IMPLANT
BNDG ELASTIC 3X5.8 VLCR STR LF (GAUZE/BANDAGES/DRESSINGS) ×3 IMPLANT
BNDG ELASTIC 4X5.8 VLCR STR LF (GAUZE/BANDAGES/DRESSINGS) ×3 IMPLANT
BNDG ESMARK 4X9 LF (GAUZE/BANDAGES/DRESSINGS) ×3 IMPLANT
BNDG GAUZE ELAST 4 BULKY (GAUZE/BANDAGES/DRESSINGS) ×3 IMPLANT
CORD BIPOLAR FORCEPS 12FT (ELECTRODE) ×3 IMPLANT
COVER SURGICAL LIGHT HANDLE (MISCELLANEOUS) ×3 IMPLANT
CUFF TOURN SGL QUICK 18X4 (TOURNIQUET CUFF) ×3 IMPLANT
DRAPE U-SHAPE 47X51 STRL (DRAPES) ×3 IMPLANT
DRSG ADAPTIC 3X8 NADH LF (GAUZE/BANDAGES/DRESSINGS) ×3 IMPLANT
GAUZE SPONGE 4X4 12PLY STRL (GAUZE/BANDAGES/DRESSINGS) ×3 IMPLANT
GAUZE XEROFORM 5X9 LF (GAUZE/BANDAGES/DRESSINGS) ×3 IMPLANT
GLOVE BIOGEL M 8.0 STRL (GLOVE) ×6 IMPLANT
GLOVE SS BIOGEL STRL SZ 8 (GLOVE) ×2 IMPLANT
GLOVE SUPERSENSE BIOGEL SZ 8 (GLOVE) ×4
GOWN STRL REUS W/ TWL LRG LVL3 (GOWN DISPOSABLE) ×1 IMPLANT
GOWN STRL REUS W/ TWL XL LVL3 (GOWN DISPOSABLE) ×1 IMPLANT
GOWN STRL REUS W/TWL LRG LVL3 (GOWN DISPOSABLE) ×3
GOWN STRL REUS W/TWL XL LVL3 (GOWN DISPOSABLE) ×3
KIT BASIN OR (CUSTOM PROCEDURE TRAY) ×3 IMPLANT
KIT TURNOVER KIT B (KITS) ×3 IMPLANT
NS IRRIG 1000ML POUR BTL (IV SOLUTION) ×3 IMPLANT
PACK ORTHO EXTREMITY (CUSTOM PROCEDURE TRAY) ×3 IMPLANT
PAD ARMBOARD 7.5X6 YLW CONV (MISCELLANEOUS) ×6 IMPLANT
PAD CAST 4YDX4 CTTN HI CHSV (CAST SUPPLIES) ×3 IMPLANT
PADDING CAST COTTON 4X4 STRL (CAST SUPPLIES) ×9
PEG LOCKING SMOOTH 2.2X20 (Screw) ×3 IMPLANT
PEG LOCKING SMOOTH 2.2X22 (Screw) ×9 IMPLANT
PEG LOCKING SMOOTH 2.2X24 (Peg) ×12 IMPLANT
PLATE STD DVR LEFT (Plate) ×3 IMPLANT
PLATE STD DVR LT 24X55 (Plate) ×1 IMPLANT
SCREW LOCK 16X2.7X 3 LD TPR (Screw) ×3 IMPLANT
SCREW LOCKING 2.7X15MM (Screw) ×6 IMPLANT
SCREW LOCKING 2.7X16 (Screw) ×9 IMPLANT
SOL PREP POV-IOD 4OZ 10% (MISCELLANEOUS) ×6 IMPLANT
SPLINT FIBERGLASS 3X12 (CAST SUPPLIES) ×3 IMPLANT
SPONGE LAP 4X18 RFD (DISPOSABLE) ×3 IMPLANT
SUT PROLENE 4 0 PS 2 18 (SUTURE) ×6 IMPLANT
SUT VIC AB 3-0 FS2 27 (SUTURE) ×3 IMPLANT
TOWEL GREEN STERILE (TOWEL DISPOSABLE) ×3 IMPLANT
TOWEL GREEN STERILE FF (TOWEL DISPOSABLE) ×3 IMPLANT
TUBE CONNECTING 12'X1/4 (SUCTIONS) ×1
TUBE CONNECTING 12X1/4 (SUCTIONS) ×2 IMPLANT
UNDERPAD 30X36 HEAVY ABSORB (UNDERPADS AND DIAPERS) ×3 IMPLANT

## 2020-04-26 NOTE — ED Provider Notes (Signed)
[facility] EMERGENCY DEPARTMENT Provider Note   CSN: [id number] Arrival date & time: 04/26/20  0938     History No chief complaint on file.   SINAI MAHANY is a 48 y.o. male.  Patient is a 49 year old male with a history of hyperlipidemia presenting today with a complaint of left arm pain and deformity.  Patient was on a chair changing a light bulb when he lost his balance and fell on an outstretched hand.  He denies hitting his head, loss of consciousness or injury elsewhere.  He does not take anticoagulation.  However he reports when he got here he started feeling a little bit lightheaded and dizzy which is now resolved.  He denies any numbness or tingling of his fingers and reports he can move all the fingers on his left hand.  He has no elbow or shoulder pain.  He reports right now his pain is minimal as long as he does not move it.  The history is provided by the patient.       No past medical history on file.  Patient Active Problem List   Diagnosis Date Noted  . LIVER FUNCTION TESTS, ABNORMAL, HX OF 10/22/2009  . HYPERLIPIDEMIA 10/21/2009  . FEVER UNSPECIFIED 10/07/2009  . NIGHT SWEATS 10/07/2009    No past surgical history on file.     No family history on file.  Social History   Tobacco Use  . Smoking status: Never Smoker  Substance Use Topics  . Alcohol use: No    Alcohol/week: 0.0 standard drinks  . Drug use: Not on file    Home Medications Prior to Admission medications   Medication Sig Start Date End Date Taking? Authorizing Provider  Atorvastatin Calcium (LIPITOR PO) Take by mouth.    [provider]  Cholecalciferol (VITAMIN D PO) Take by mouth.    [provider]  Multiple Vitamin (MULTIVITAMIN) capsule Take 1 capsule by mouth daily.    [provider]  Omega-3 Fatty Acids (FISH OIL PO) Take by mouth.    [provider]    Allergies    Patient has no known allergies.  Review of Systems    Review of Systems  All other systems reviewed and are negative.   Physical Exam Updated Vital Signs BP (!) 77/44 (BP Location: Right Arm)   Pulse 45   Temp 98 F (36.7 C) (Oral)   Resp 18   Ht 6' (1.829 m)   Wt 90.7 kg   SpO2 100%   BMI 27.12 kg/m   Physical Exam Vitals and nursing note reviewed.  Constitutional:      General: He is not in acute distress.    Appearance: Normal appearance. He is normal weight.  HENT:     Head: Normocephalic.  Eyes:     Extraocular Movements: Extraocular movements intact.     Pupils: Pupils are equal, round, and reactive to light.  Cardiovascular:     Rate and Rhythm: Normal rate.     Pulses: Normal pulses.  Pulmonary:     Effort: Pulmonary effort is normal. No respiratory distress.  Abdominal:     General: Abdomen is flat.  Musculoskeletal:        General: Tenderness and signs of injury present.     Comments: Deformity of the left wrist with 1+ radial pulse.  Able to wiggle all 5 fingers and sensation intact.  Left elbow and shoulder are within normal limits  Skin:    General: Skin is  warm and dry.  Neurological:     General: No focal deficit present.     Mental Status: He is alert. Mental status is at baseline.  Psychiatric:        Mood and Affect: Mood normal.        Behavior: Behavior normal.     ED Results / Procedures / Treatments   Labs (all labs ordered are listed, but only abnormal results are displayed) Labs Reviewed - No data to display  EKG None  Radiology DG Wrist Complete Left  Result Date: 04/26/2020 CLINICAL DATA:  49 year old male with left wrist pain after falling off a chair and landing on his wrist EXAM: LEFT WRIST - COMPLETE 3+ VIEW COMPARISON:  None. FINDINGS: Comminuted, impacted and significantly dorsally displaced distal radius fracture. The radiocarpal joint remains congruent. The carpus is displaced posteriorly along with the distal radius fracture fragment. No definitive intra-articular  extension. The ulna appears intact. IMPRESSION: Acute comminuted, mildly impacted and significantly dorsally displaced distal radius fracture. Electronically Signed   By: Malachy Moan M.D.   On: 04/26/2020 10:16    Procedures Procedures (including critical care time)  Medications Ordered in ED Medications  lidocaine-EPINEPHrine (XYLOCAINE W/EPI) 2 %-1:200000 (PF) injection 10 mL (has no administration in time range)  ibuprofen (ADVIL) tablet 400 mg (has no administration in time range)    ED Course  I have reviewed the triage vital signs and the nursing notes.  Pertinent labs & imaging results that were available during my care of the patient were reviewed by me and considered in my medical decision making (see chart for details).    MDM Rules/Calculators/A&P                          Patient presenting after trauma and injury to the left wrist.  Based on physical evaluation concern for both bone forearm fracture and displacement.  He is currently neurovascularly intact.  Upon arrival to the emergency room patient was hypotensive and bradycardic feel that he was having near vagal syncope which is now resolved.  Now heart rate and blood pressure are normal.  Patient states the pain is not severe unless he tries to move and is requesting an ibuprofen.  Will hematoma block, get images and discuss with hand surgery.  10:59 AM Patient with an acute comminuted mildly impacted and significantly dorsally displaced distal radius fracture.  After hematoma block patient reports the pain is significantly improved.  N.p.o. time is 6 AM this morning he had a bowl of oatmeal and black coffee.  Spoke with Dr. Amanda Pea who is coming to evaluate the patient requested Covid swab as he would like to take him to the OR today.  Findings discussed with the patient and plan.  MDM Number of Diagnoses or Management Options   Amount and/or Complexity of Data Reviewed Tests in the radiology section of CPT:  ordered and reviewed Independent visualization of images, tracings, or specimens: yes  Risk of Complications, Morbidity, and/or Mortality Presenting problems: moderate Diagnostic procedures: low Management options: low  Patient Progress Patient progress: stable   Final Clinical Impression(s) / ED Diagnoses Final diagnoses:  Other closed intra-articular fracture of distal end of left radius, initial encounter    Rx / DC Orders ED Discharge Orders    None       Gwyneth Sprout, MD 04/26/20 1110

## 2020-04-26 NOTE — Anesthesia Procedure Notes (Signed)
Anesthesia Regional Block: Supraclavicular block   Pre-Anesthetic Checklist: ,, timeout performed, Correct Patient, Correct Site, Correct Laterality, Correct Procedure, Correct Position, site marked, Risks and benefits discussed,  Surgical consent,  Pre-op evaluation,  At surgeon's request and post-op pain management  Laterality: Left  Prep: chloraprep       Needles:  Injection technique: Single-shot  Needle Type: Echogenic Stimulator Needle     Needle Length: 9cm  Needle Gauge: 21     Additional Needles:   Procedures:, nerve stimulator,,, ultrasound used (permanent image in chart),,,,   Nerve Stimulator or Paresthesia:  Response: biceps and finger responses, 0.5 mA,   Additional Responses:   Narrative:  Start time: 04/26/2020 1:22 PM End time: 04/26/2020 1:29 PM Injection made incrementally with aspirations every 5 mL.  Performed by: Personally  Anesthesiologist: Marcene Duos, MD

## 2020-04-26 NOTE — Transfer of Care (Signed)
Immediate Anesthesia Transfer of Care Note  Patient: Arthur Guerrero  Procedure(s) Performed: OPEN REDUCTION INTERNAL FIXATION (ORIF) DISTAL RADIAL FRACTURE (Left Wrist)  Patient Location: PACU  Anesthesia Type:General  Level of Consciousness: awake, alert  and oriented  Airway & Oxygen Therapy: Patient Spontanous Breathing and Patient connected to face mask oxygen  Post-op Assessment: Report given to RN and Post -op Vital signs reviewed and stable  Post vital signs: Reviewed and stable  Last Vitals:  Vitals Value Taken Time  BP 122/75 04/26/20 1510  Temp    Pulse 78 04/26/20 1511  Resp 18 04/26/20 1511  SpO2 100 % 04/26/20 1511  Vitals shown include unvalidated device data.  Last Pain:  Vitals:   04/26/20 1233  TempSrc: Oral  PainSc: 2          Complications: No complications documented.

## 2020-04-26 NOTE — Discharge Instructions (Signed)

## 2020-04-26 NOTE — Anesthesia Postprocedure Evaluation (Signed)
Anesthesia Post Note  Patient: Arthur Guerrero  Procedure(s) Performed: OPEN REDUCTION INTERNAL FIXATION (ORIF) DISTAL RADIAL FRACTURE (Left Wrist)     Patient location during evaluation: PACU Anesthesia Type: General Level of consciousness: awake and alert Pain management: pain level controlled Vital Signs Assessment: post-procedure vital signs reviewed and stable Respiratory status: spontaneous breathing, nonlabored ventilation, respiratory function stable and patient connected to nasal cannula oxygen Cardiovascular status: blood pressure returned to baseline and stable Postop Assessment: no apparent nausea or vomiting Anesthetic complications: no   No complications documented.  Last Vitals:  Vitals:   04/26/20 1513 04/26/20 1527  BP: 122/75 (!) 131/77  Pulse: 84 80  Resp: 16 19  Temp: 36.7 C   SpO2:  95%    Last Pain:  Vitals:   04/26/20 1513  TempSrc:   PainSc: 0-No pain                 Kennieth Rad

## 2020-04-26 NOTE — Anesthesia Preprocedure Evaluation (Signed)
Anesthesia Evaluation  Patient identified by MRN, date of birth, ID band Patient awake    Reviewed: Allergy & Precautions, NPO status , Patient's Chart, lab work & pertinent test results  Airway Mallampati: II  TM Distance: >3 FB Neck ROM: Full    Dental  (+) Dental Advisory Given   Pulmonary neg pulmonary ROS,    breath sounds clear to auscultation       Cardiovascular negative cardio ROS   Rhythm:Regular Rate:Normal     Neuro/Psych negative neurological ROS     GI/Hepatic negative GI ROS, Neg liver ROS,   Endo/Other  negative endocrine ROS  Renal/GU negative Renal ROS     Musculoskeletal   Abdominal   Peds  Hematology negative hematology ROS (+)   Anesthesia Other Findings   Reproductive/Obstetrics                             Anesthesia Physical Anesthesia Plan  ASA: I  Anesthesia Plan: General   Post-op Pain Management:  Regional for Post-op pain   Induction: Intravenous  PONV Risk Score and Plan: 2 and Dexamethasone, Ondansetron and Treatment may vary due to age or medical condition  Airway Management Planned: LMA  Additional Equipment: None  Intra-op Plan:   Post-operative Plan: Extubation in OR  Informed Consent: I have reviewed the patients History and Physical, chart, labs and discussed the procedure including the risks, benefits and alternatives for the proposed anesthesia with the patient or authorized representative who has indicated his/her understanding and acceptance.     Dental advisory given  Plan Discussed with: CRNA  Anesthesia Plan Comments:         Anesthesia Quick Evaluation

## 2020-04-26 NOTE — Op Note (Signed)
Operative note April 26, 2020  Arthur Severin MD  Preoperative diagnosis: Left distal radius fracture comminuted complex greater than 3 part intra-articular  Postop diagnosis: Same  Procedure: #1 left radius open reduction internal fixation comminuted complex distal radius fracture with DVR [employer] cross lock plate and screw construct.  This was a greater than 3 part intra-articular fracture.  #2 AP lateral and oblique x-rays performed examined and interpreted by myself  Romyn Boswell MD  Anesthesia:  Estimated blood loss minimal  Complications none immediate  Operative indications the patient presents for evaluation and surgical care.  Patient understands risk benefits and desires to proceed.  We have discussed with the patient all issues plans and concerns with this in mind we will proceed accordingly. We are planning surgery for your upper extremity. The risk and benefits of surgery to include risk of bleeding, infection, anesthesia,  damage to normal structures and failure of the surgery to accomplish its intended goals of relieving symptoms and restoring function have been discussed in detail. With this in mind we plan to proceed. I have specifically discussed with the patient the pre-and postoperative regime and the dos and don'ts and risk and benefits in great detail. Risk and benefits of surgery also include risk of dystrophy(CRPS), chronic nerve pain, failure of the healing process to go onto completion and other inherent risks of surgery The relavent the pathophysiology of the disease/injury process, as well as the alternatives for treatment and postoperative course of action has been discussed in great detail with the patient who desires to proceed.  We will do everything in our power to help you (the patient) restore function to the upper extremity. It is a pleasure to see this patient today.    Operative procedure: Patient was seen by myself and anesthesia.  Appropriate anesthesia was  induced and following this the patient was prepped with a Hibiclens pre-scrub followed by 10-minute surgical Betadine scrub and paint.  Once this was completed the extremity was elevated and the tourniquet was insufflated to 250 mmHg.  Timeout was observed preoperative antibiotics were given and the patient then underwent a very careful and cautious approach to the extremity with volar radial incision under 250 mm tourniquet control.  FCR tendon sheath was identified and dissected.  There were no complicating features.  Once this was completed the carpal canal contents were retracted ulnarly and the FCR was retracted radially.  We took very meticulous care of the radial artery and the carpal canal contents during the approach.  The pronator was accessed incised and lifted off of the fracture.  The fracture was then reassembled with standard orthopedic equipment and a DVR plate and screw construct from [employer] was accomplished in terms of placement and fixation of the fracture. I did utilize a volar rim plate.  There were no complications and the patient's anatomy was recreated quite nicely. Adequate radial height, volar tilt and radial inclination was restored.  The distal radial ulnar joint, radiocarpal and midcarpal joints all were  stable and satisfactory.  We irrigated copiously and closed the pronator with 3-0 Vicryl followed by closure of the skin edge with Prolene.  Once again, the distal radius underwent open reduction internal fixation without complications.  The distal radial ulnar joint was stable.  The patient had no complications.  All radiographic parameters look quite well following the fixation.  Standard dressing of Adaptic Xeroform 4 x 4's gauze web roll Kerlix and a volar splint were applied.  The patient understands instructions of elevate  move massage fingers notify us any problems occur and follow-up care according to our standard protocol for a DVR plate and screw construct.  He  has been a pleasure participate in the patient's care and we look forward to spent in the patient's recovery. I discussed all issues with his family.  We will plan for RTC 2-1/2 weeks notify me should any problems occur and elevate move massage fingers and notify me should excessive cast tightness occur.  Discharge medicines oxycodone Keflex Robaxin and Zofran Arthur Severin MD

## 2020-04-26 NOTE — H&P (Signed)
Arthur Guerrero is an 49 y.o. male.   Chief Complaint: Comminuted complex left wrist fracture status post fall HPI: Patient presents after a same level fall significant injury to the wrist is notable.  He has comminuted complex grade 3 part articular fracture to the left distal radius.  I recommend open reduction internal fixation.  He understands this and desires to proceed.  He denies other injury.  I have reviewed his findings and discussed with him all issues plans and concerns.We are planning surgery for your upper extremity. The risk and benefits of surgery to include risk of bleeding, infection, anesthesia,  damage to normal structures and failure of the surgery to accomplish its intended goals of relieving symptoms and restoring function have been discussed in detail. With this in mind we plan to proceed. I have specifically discussed with the patient the pre-and postoperative regime and the dos and don'ts and risk and benefits in great detail. Risk and benefits of surgery also include risk of dystrophy(CRPS), chronic nerve pain, failure of the healing process to go onto completion and other inherent risks of surgery The relavent the pathophysiology of the disease/injury process, as well as the alternatives for treatment and postoperative course of action has been discussed in great detail with the patient who desires to proceed.  We will do everything in our power to help you (the patient) restore function to the upper extremity. It is a pleasure to see this patient today.   History reviewed. No pertinent past medical history.  History reviewed. No pertinent surgical history.  History reviewed. No pertinent family history. Social History:  reports that he has never smoked. He has never used smokeless tobacco. He reports that he does not drink alcohol. No history on file for drug use.  Allergies: No Known Allergies  (Not in a hospital admission)   No results found for this or any  previous visit (from the past 48 hour(s)). DG Wrist Complete Left  Result Date: 04/26/2020 CLINICAL DATA:  49 year old male with left wrist pain after falling off a chair and landing on his wrist EXAM: LEFT WRIST - COMPLETE 3+ VIEW COMPARISON:  None. FINDINGS: Comminuted, impacted and significantly dorsally displaced distal radius fracture. The radiocarpal joint remains congruent. The carpus is displaced posteriorly along with the distal radius fracture fragment. No definitive intra-articular extension. The ulna appears intact. IMPRESSION: Acute comminuted, mildly impacted and significantly dorsally displaced distal radius fracture. Electronically Signed   By: Malachy Moan M.D.   On: 04/26/2020 10:16   DG MINI C-ARM IMAGE ONLY  Result Date: 04/26/2020 There is no interpretation for this exam.  This order is for images obtained during a surgical procedure.  Please See "Surgeries" Tab for more information regarding the procedure.    Review of Systems  HENT: Negative.   Respiratory: Negative.   Cardiovascular: Negative.   Gastrointestinal: Negative.   Genitourinary: Negative.     Blood pressure (!) 132/84, pulse 70, temperature 98 F (36.7 C), temperature source Oral, resp. rate 13, height 6' (1.829 m), weight 90.7 kg, SpO2 99 %. Physical Exam  Comminuted complex closed unstable distal radius fracture left upper extremity.  Skin is stable.  No evidence of infection.  I removed his ring.  He has a hematoma block and thus has some numbness in his fingers but prior to the hematoma block performed by the ER staff had normal neurovascular status.  We can move forward with surgical stabilization.  The patient is alert and oriented in no acute  distress. The patient complains of pain in the affected upper extremity.  The patient is noted to have a normal HEENT exam. Lung fields show equal chest expansion and no shortness of breath. Abdomen exam is nontender without distention. Lower  extremity examination does not show any fracture dislocation or blood clot symptoms. Pelvis is stable and the neck and back are stable and nontender. Assessment/Plan We will plan open reduction internal fixation left distal radius fracture with repair reconstruction is necessary.  Patient understands this.  I had a lengthy conversation about many and all issues involved in the current care plan.  We are planning surgery for your upper extremity. The risk and benefits of surgery to include risk of bleeding, infection, anesthesia,  damage to normal structures and failure of the surgery to accomplish its intended goals of relieving symptoms and restoring function have been discussed in detail. With this in mind we plan to proceed. I have specifically discussed with the patient the pre-and postoperative regime and the dos and don'ts and risk and benefits in great detail. Risk and benefits of surgery also include risk of dystrophy(CRPS), chronic nerve pain, failure of the healing process to go onto completion and other inherent risks of surgery The relavent the pathophysiology of the disease/injury process, as well as the alternatives for treatment and postoperative course of action has been discussed in great detail with the patient who desires to proceed.  We will do everything in our power to help you (the patient) restore function to the upper extremity. It is a pleasure to see this patient today.   [name], MD 04/26/2020, 11:31 AM

## 2020-04-26 NOTE — ED Triage Notes (Signed)
Pt reports he was standing on ladder unplugging his cable box when he slipped and fell landing on his Left arm. Obvious deformity noted

## 2020-04-26 NOTE — Anesthesia Procedure Notes (Signed)
Procedure Name: LMA Insertion Date/Time: 04/26/2020 1:48 PM Performed by: Sheppard Evens, CRNA Pre-anesthesia Checklist: Patient identified, Emergency Drugs available, Suction available and Patient being monitored Patient Re-evaluated:Patient Re-evaluated prior to induction Oxygen Delivery Method: Circle System Utilized Preoxygenation: Pre-oxygenation with 100% oxygen Induction Type: IV induction Ventilation: Mask ventilation without difficulty LMA: LMA inserted LMA Size: 4.0 Number of attempts: 1 Airway Equipment and Method: Bite block Placement Confirmation: positive ETCO2 Tube secured with: Tape Dental Injury: Teeth and Oropharynx as per pre-operative assessment

## 2020-04-28 ENCOUNTER — Encounter (HOSPITAL_COMMUNITY): Payer: Self-pay | Admitting: Orthopedic Surgery

## 2020-05-12 DIAGNOSIS — R1311 Dysphagia, oral phase: Secondary | ICD-10-CM | POA: Diagnosis not present

## 2020-05-13 ENCOUNTER — Other Ambulatory Visit: Payer: Self-pay | Admitting: Gastroenterology

## 2020-05-13 DIAGNOSIS — R1311 Dysphagia, oral phase: Secondary | ICD-10-CM

## 2020-05-29 ENCOUNTER — Ambulatory Visit
Admission: RE | Admit: 2020-05-29 | Discharge: 2020-05-29 | Disposition: A | Discharge status: Home / Self Care | Payer: BC Managed Care – PPO | Source: Ambulatory Visit | Attending: Gastroenterology | Admitting: Gastroenterology

## 2020-05-29 DIAGNOSIS — R1311 Dysphagia, oral phase: Secondary | ICD-10-CM

## 2020-07-25 DIAGNOSIS — Z0000 Encounter for general adult medical examination without abnormal findings: Secondary | ICD-10-CM | POA: Diagnosis not present

## 2020-07-25 DIAGNOSIS — E785 Hyperlipidemia, unspecified: Secondary | ICD-10-CM | POA: Diagnosis not present

## 2020-08-01 DIAGNOSIS — Z0000 Encounter for general adult medical examination without abnormal findings: Secondary | ICD-10-CM | POA: Diagnosis not present

## 2020-08-08 DIAGNOSIS — Z20822 Contact with and (suspected) exposure to covid-19: Secondary | ICD-10-CM | POA: Diagnosis not present

## 2020-08-08 DIAGNOSIS — Z03818 Encounter for observation for suspected exposure to other biological agents ruled out: Secondary | ICD-10-CM | POA: Diagnosis not present

## 2020-10-30 DIAGNOSIS — H35343 Macular cyst, hole, or pseudohole, bilateral: Secondary | ICD-10-CM | POA: Diagnosis not present

## 2021-07-28 DIAGNOSIS — Z125 Encounter for screening for malignant neoplasm of prostate: Secondary | ICD-10-CM | POA: Diagnosis not present

## 2021-07-28 DIAGNOSIS — Z0000 Encounter for general adult medical examination without abnormal findings: Secondary | ICD-10-CM | POA: Diagnosis not present

## 2021-08-05 DIAGNOSIS — Z0000 Encounter for general adult medical examination without abnormal findings: Secondary | ICD-10-CM | POA: Diagnosis not present

## 2021-08-05 DIAGNOSIS — Z23 Encounter for immunization: Secondary | ICD-10-CM | POA: Diagnosis not present

## 2021-08-05 DIAGNOSIS — Z1211 Encounter for screening for malignant neoplasm of colon: Secondary | ICD-10-CM | POA: Diagnosis not present

## 2021-08-05 DIAGNOSIS — E559 Vitamin D deficiency, unspecified: Secondary | ICD-10-CM | POA: Diagnosis not present

## 2021-08-05 DIAGNOSIS — E785 Hyperlipidemia, unspecified: Secondary | ICD-10-CM | POA: Diagnosis not present

## 2021-10-09 DIAGNOSIS — Z1211 Encounter for screening for malignant neoplasm of colon: Secondary | ICD-10-CM | POA: Diagnosis not present

## 2021-10-15 DIAGNOSIS — K635 Polyp of colon: Secondary | ICD-10-CM | POA: Diagnosis not present

## 2021-11-18 DIAGNOSIS — K648 Other hemorrhoids: Secondary | ICD-10-CM | POA: Diagnosis not present

## 2021-11-18 DIAGNOSIS — Z1211 Encounter for screening for malignant neoplasm of colon: Secondary | ICD-10-CM | POA: Diagnosis not present

## 2021-11-18 DIAGNOSIS — K5730 Diverticulosis of large intestine without perforation or abscess without bleeding: Secondary | ICD-10-CM | POA: Diagnosis not present

## 2022-02-01 IMAGING — DX DG WRIST COMPLETE 3+V*L*
4 series · 4 of 4 positions shown · non-contrast
Comparison: None.

CLINICAL DATA: 48-year-old male with left wrist pain after falling
off a chair and landing on his wrist

EXAM:
LEFT WRIST - COMPLETE 3+ VIEW

[wrist pa]
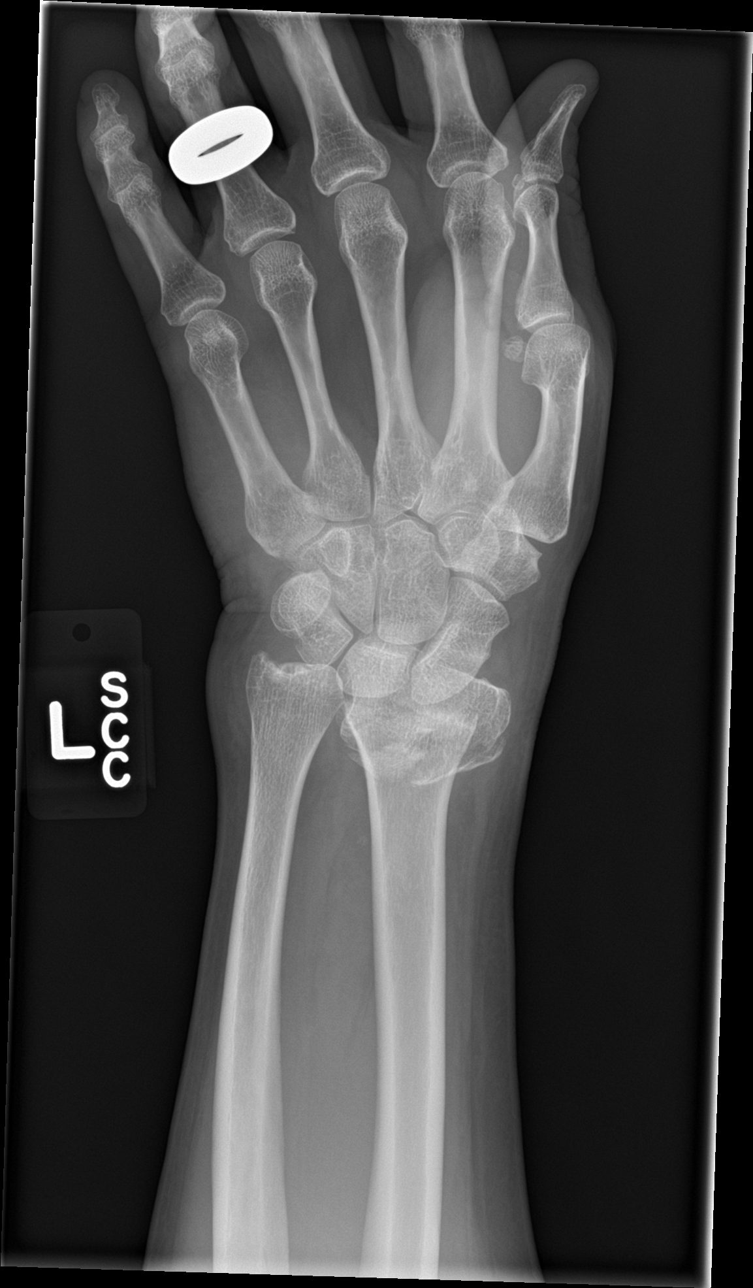

[wrist obl]
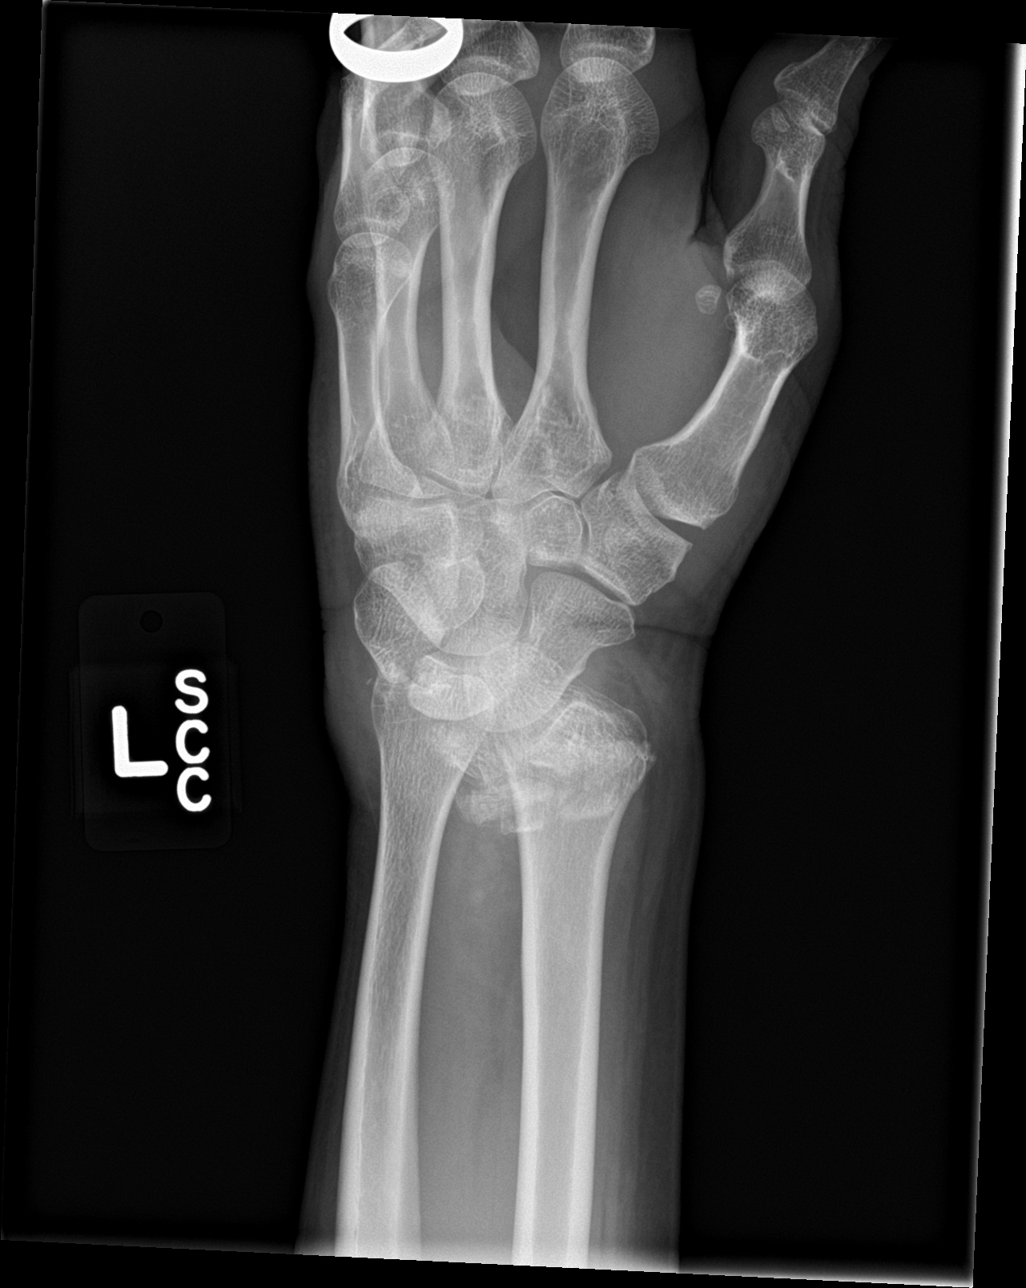

[wrist lat]
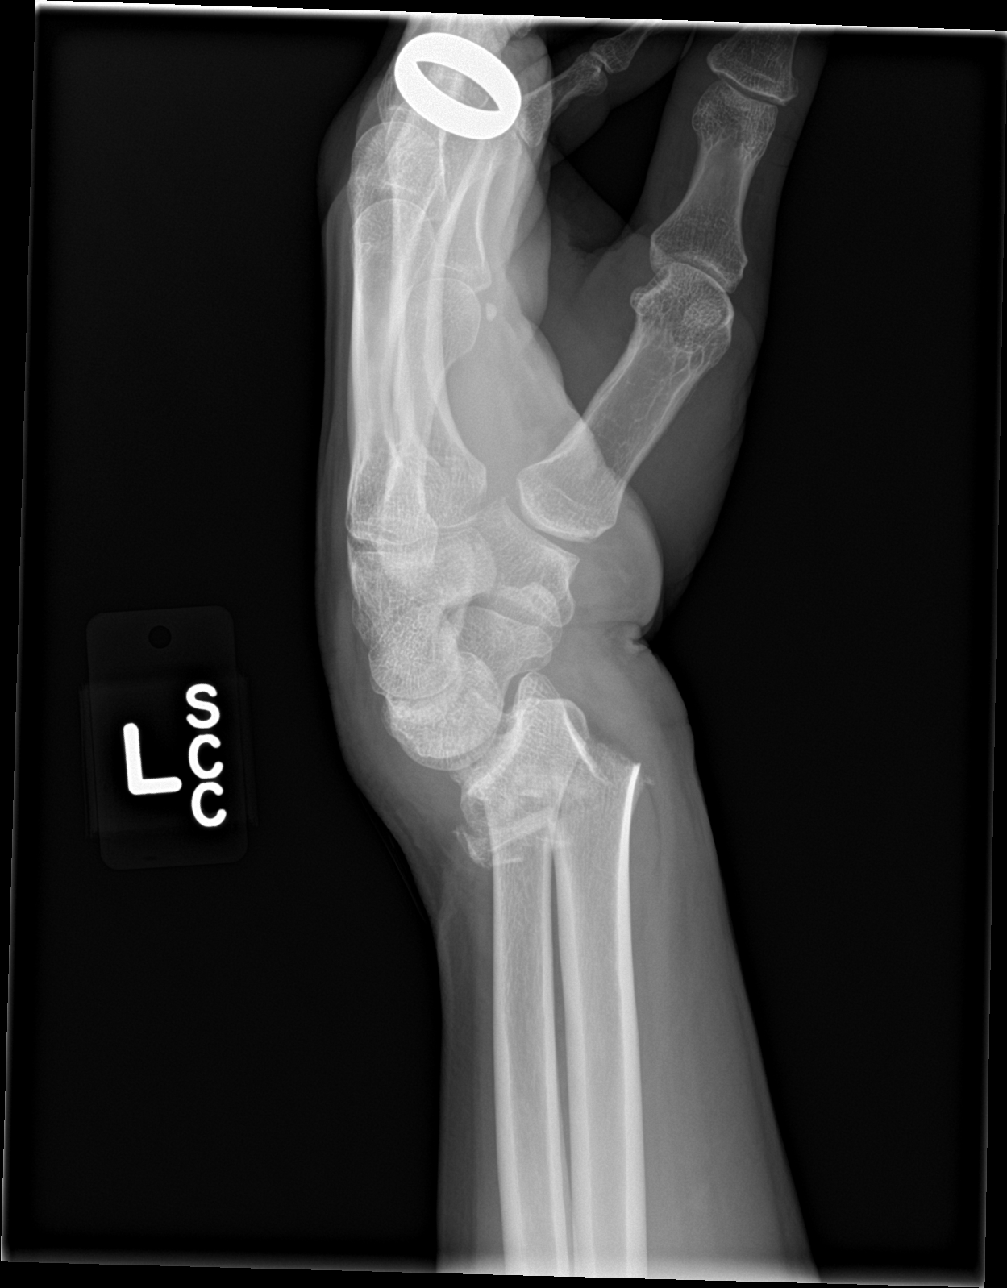

[wrist navicular]
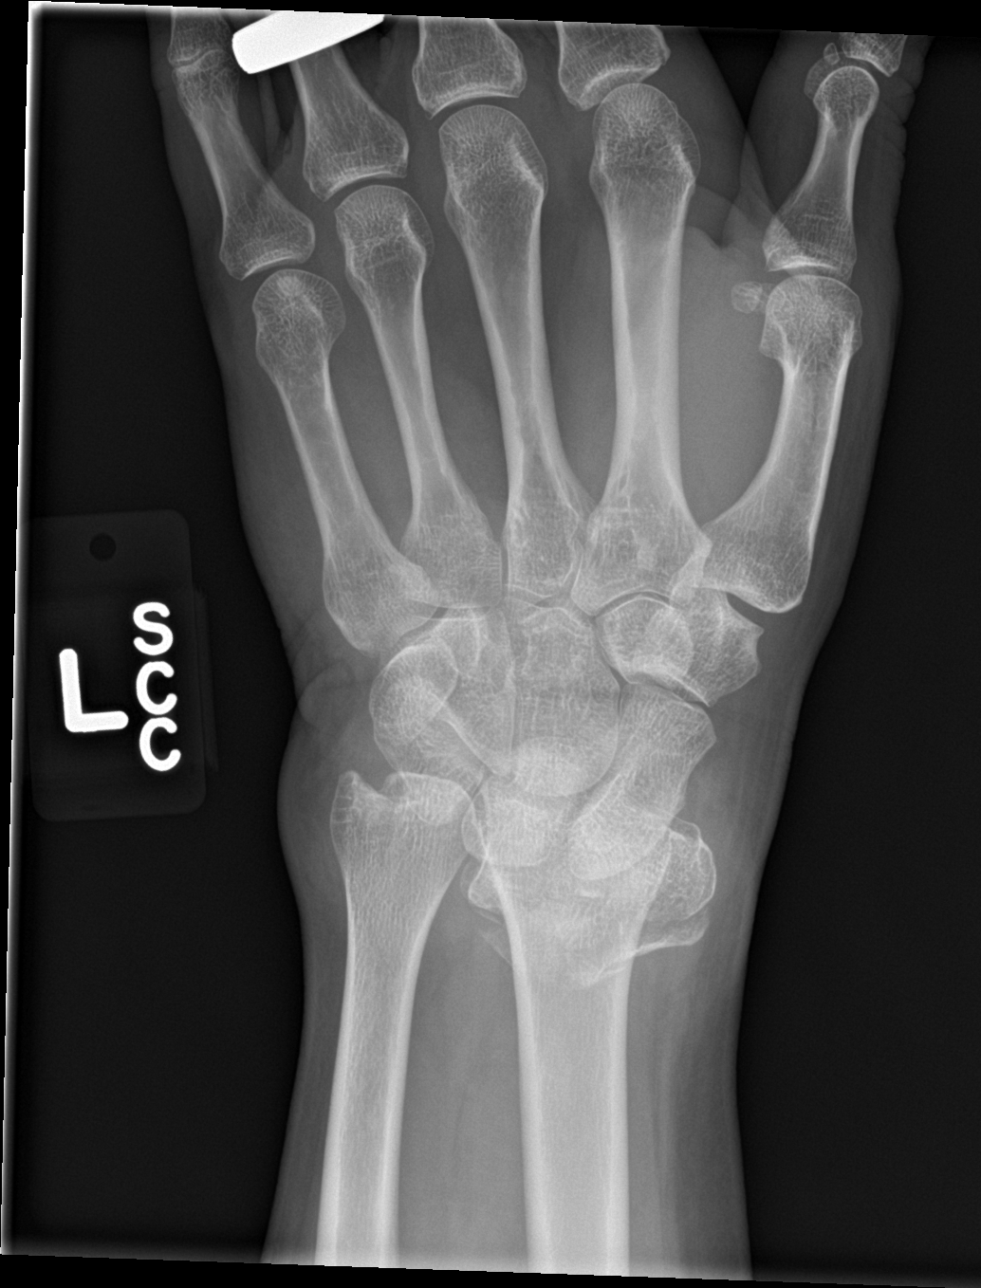

[4 of 4 positions shown; findings below may reference images not displayed]

FINDINGS: Comminuted, impacted and significantly dorsally displaced distal
radius fracture. The radiocarpal joint remains congruent. The carpus
is displaced posteriorly along with the distal radius fracture
fragment. No definitive intra-articular extension. The ulna appears
intact.
IMPRESSION: Acute comminuted, mildly impacted and significantly dorsally
displaced distal radius fracture.

## 2022-04-20 DIAGNOSIS — J069 Acute upper respiratory infection, unspecified: Secondary | ICD-10-CM | POA: Diagnosis not present

## 2022-08-03 DIAGNOSIS — Z0000 Encounter for general adult medical examination without abnormal findings: Secondary | ICD-10-CM | POA: Diagnosis not present

## 2022-08-03 DIAGNOSIS — Z125 Encounter for screening for malignant neoplasm of prostate: Secondary | ICD-10-CM | POA: Diagnosis not present

## 2022-08-10 DIAGNOSIS — Z6828 Body mass index (BMI) 28.0-28.9, adult: Secondary | ICD-10-CM | POA: Diagnosis not present

## 2022-08-10 DIAGNOSIS — E559 Vitamin D deficiency, unspecified: Secondary | ICD-10-CM | POA: Diagnosis not present

## 2022-08-10 DIAGNOSIS — R7401 Elevation of levels of liver transaminase levels: Secondary | ICD-10-CM | POA: Diagnosis not present

## 2022-08-10 DIAGNOSIS — Z23 Encounter for immunization: Secondary | ICD-10-CM | POA: Diagnosis not present

## 2022-08-10 DIAGNOSIS — E785 Hyperlipidemia, unspecified: Secondary | ICD-10-CM | POA: Diagnosis not present

## 2022-08-10 DIAGNOSIS — Z0000 Encounter for general adult medical examination without abnormal findings: Secondary | ICD-10-CM | POA: Diagnosis not present

## 2022-08-11 DIAGNOSIS — R7401 Elevation of levels of liver transaminase levels: Secondary | ICD-10-CM | POA: Diagnosis not present

## 2022-08-24 DIAGNOSIS — Z23 Encounter for immunization: Secondary | ICD-10-CM | POA: Diagnosis not present

## 2022-11-25 DIAGNOSIS — Z23 Encounter for immunization: Secondary | ICD-10-CM | POA: Diagnosis not present

## 2023-08-09 DIAGNOSIS — M25562 Pain in left knee: Secondary | ICD-10-CM | POA: Diagnosis not present

## 2023-08-11 DIAGNOSIS — Z125 Encounter for screening for malignant neoplasm of prostate: Secondary | ICD-10-CM | POA: Diagnosis not present

## 2023-08-11 DIAGNOSIS — Z0001 Encounter for general adult medical examination with abnormal findings: Secondary | ICD-10-CM | POA: Diagnosis not present

## 2023-08-16 DIAGNOSIS — Z23 Encounter for immunization: Secondary | ICD-10-CM | POA: Diagnosis not present

## 2023-08-23 ENCOUNTER — Other Ambulatory Visit (HOSPITAL_COMMUNITY): Payer: Self-pay | Admitting: Internal Medicine

## 2023-08-23 DIAGNOSIS — Z8249 Family history of ischemic heart disease and other diseases of the circulatory system: Secondary | ICD-10-CM

## 2023-08-23 DIAGNOSIS — Z0000 Encounter for general adult medical examination without abnormal findings: Secondary | ICD-10-CM | POA: Diagnosis not present

## 2023-08-23 DIAGNOSIS — Z23 Encounter for immunization: Secondary | ICD-10-CM | POA: Diagnosis not present

## 2023-10-03 ENCOUNTER — Ambulatory Visit (HOSPITAL_COMMUNITY)
Admission: RE | Admit: 2023-10-03 | Discharge: 2023-10-03 | Disposition: A | Discharge status: Home / Self Care | Payer: Self-pay | Source: Ambulatory Visit | Attending: Internal Medicine | Admitting: Internal Medicine

## 2023-10-03 DIAGNOSIS — Z8249 Family history of ischemic heart disease and other diseases of the circulatory system: Secondary | ICD-10-CM | POA: Insufficient documentation

## 2023-10-21 DIAGNOSIS — I2510 Atherosclerotic heart disease of native coronary artery without angina pectoris: Secondary | ICD-10-CM | POA: Diagnosis not present

## 2023-12-02 DIAGNOSIS — I2510 Atherosclerotic heart disease of native coronary artery without angina pectoris: Secondary | ICD-10-CM | POA: Diagnosis not present

## 2024-02-01 DIAGNOSIS — J3489 Other specified disorders of nose and nasal sinuses: Secondary | ICD-10-CM | POA: Diagnosis not present

## 2024-02-01 DIAGNOSIS — J0100 Acute maxillary sinusitis, unspecified: Secondary | ICD-10-CM | POA: Diagnosis not present

## 2024-08-22 DIAGNOSIS — Z125 Encounter for screening for malignant neoplasm of prostate: Secondary | ICD-10-CM | POA: Diagnosis not present

## 2024-08-22 DIAGNOSIS — Z0000 Encounter for general adult medical examination without abnormal findings: Secondary | ICD-10-CM | POA: Diagnosis not present

## 2024-08-28 DIAGNOSIS — R972 Elevated prostate specific antigen [PSA]: Secondary | ICD-10-CM | POA: Diagnosis not present

## 2024-08-28 DIAGNOSIS — I2510 Atherosclerotic heart disease of native coronary artery without angina pectoris: Secondary | ICD-10-CM | POA: Diagnosis not present

## 2024-08-28 DIAGNOSIS — Z0000 Encounter for general adult medical examination without abnormal findings: Secondary | ICD-10-CM | POA: Diagnosis not present

## 2024-08-28 DIAGNOSIS — E559 Vitamin D deficiency, unspecified: Secondary | ICD-10-CM | POA: Diagnosis not present

## 2024-08-28 DIAGNOSIS — E785 Hyperlipidemia, unspecified: Secondary | ICD-10-CM | POA: Diagnosis not present

## 2024-08-28 DIAGNOSIS — Z8042 Family history of malignant neoplasm of prostate: Secondary | ICD-10-CM | POA: Diagnosis not present

## 2024-08-28 DIAGNOSIS — Z23 Encounter for immunization: Secondary | ICD-10-CM | POA: Diagnosis not present

## 2024-08-29 NOTE — Progress Notes (Signed)
 Cardiology Office Note   Date:  09/04/2024  ID:  Arthur Guerrero, DOB 04-09-1971, MRN [id number] PCP: Clarice Nottingham, MD  [facility] Providers Cardiologist:  None    History of Present Illness Arthur Guerrero is a 53 y.o. male with a past medical history of hyperlipidemia, Gilberts sydrome. Presents tdoay for evaluation of abnormal EKG   Patient previously underwent CT calcium scoring on 10/03/23 that showed a coronary calcium score of 177 (80th percentile). Has been on lipitor for cholesterol management.   Patient was seen by PCP on 08/29/24 for annual physical. EKG reportedly showed T wave inversions in V4-V6. He was referred to cardiology for further evaluation.   Today, patient presents for follow-up of abnormal EKG.  He had gone for his annual physical and had an EKG done through his primary care provider.  Patient tells me that he has not had EKGs before this.  He has a family history of hyperlipidemia and CAD.  His dad had bypass surgery when he was around 52 years old.  Patient has a remote tobacco use history, has not smoked in 30+ years.  He does have a history of high cholesterol which has been very well-managed with use of Lipitor and Zetia.  He had labs drawn in November that showed LDL 47.  Patient exercises 3-4 days/week doing [employer].  This is usually very cardio intensive.  He has intentionally lost about 20 pounds in the past 3 months.  Patient denies chest pain or dyspnea on exertion.  He denies dizziness, syncope, near syncope.  Has rare palpitations but nothing that has been sustained or significant.  Studies Reviewed EKG Interpretation Date/Time:  Tuesday September 04 2024 13:26:10 EST Ventricular Rate:  66 PR Interval:  186 QRS Duration:  90 QT Interval:  388 QTC Calculation: 406 R Axis:   65  Text Interpretation: Normal sinus rhythm Nonspecific T wave abnormality No previous ECGs available Confirmed by Vicci Sauer [phone]) on 09/04/2024 1:28:37 PM      Risk Assessment/Calculations           Physical Exam VS:  BP 100/80   Pulse 66   Ht 6' (1.829 m)   Wt 204 lb (92.5 kg)   SpO2 98%   BMI 27.67 kg/m        Wt Readings from Last 3 Encounters:  09/04/24 204 lb (92.5 kg)  04/26/20 200 lb (90.7 kg)    GEN: Well nourished, well developed in no acute distress.  Sitting comfortably on the exam table NECK: No JVD CARDIAC: RRR, no murmurs, rubs, gallops.  Radial pulses 2+ bilaterally RESPIRATORY:  Clear to auscultation without rales, wheezing or rhonchi.  Normal work of breathing on room air ABDOMEN: Soft, non-tender, non-distended EXTREMITIES:  No edema in BLE; No deformity   ASSESSMENT AND PLAN  Abnormal EKG - Patient had an annual physical with his primary care provider earlier this month.  EKG from that appointment reviewed - showed normal sinus rhythm with T wave inversions in leads V4-V6.  Patient tells me this is the first EKG has had done - EKG today shows normal sinus rhythm with less pronounced TWI in V4-V6.  - Patient denies chest pain, dyspnea on exertion, palpitations, dizziness, syncope or near syncope. - Ordered echocardiogram. If normal, can likely follow up PRN   Hyperlipidemia Family history of CAD -Patient has significant family history of hyperlipidemia.  His father also had bypass surgery when he was 52 years old. -Previously had coronary calcium scoring in  09/2023 that showed coronary calcium score of 177 (80th percentile)  - He is currently on Lipitor 20 mg daily and Zetia 10 mg daily.  Lipid panel from 07/2024 showed LDL 47, HDL 53, triglycerides 82, total cholesterol 116 -Patient denies chest pain or dyspnea on exertion.  He recently lost about 20 pounds.  He does not smoke.  Exercises 3-4 days/week with cardio intensive exercises -Encourage patient to continue to increase physical activity as tolerated and to continue current cholesterol medications    Dispo: Follow-up as needed pending echocardiogram    Signed, Rollo FABIENE Louder, PA-C

## 2024-09-04 ENCOUNTER — Ambulatory Visit: Attending: Cardiology | Admitting: Cardiology

## 2024-09-04 ENCOUNTER — Encounter: Payer: Self-pay | Admitting: Cardiology

## 2024-09-04 VITALS — BP 100/80 | HR 66 | Ht 72.0 in | Wt 204.0 lb

## 2024-09-04 DIAGNOSIS — E782 Mixed hyperlipidemia: Secondary | ICD-10-CM | POA: Diagnosis not present

## 2024-09-04 DIAGNOSIS — Z8249 Family history of ischemic heart disease and other diseases of the circulatory system: Secondary | ICD-10-CM

## 2024-09-04 DIAGNOSIS — R9431 Abnormal electrocardiogram [ECG] [EKG]: Secondary | ICD-10-CM | POA: Diagnosis not present

## 2024-09-04 NOTE — Patient Instructions (Signed)
 Medication Instructions:  Your physician recommends that you continue on your current medications as directed. Please refer to the Current Medication list given to you today.  *If you need a refill on your cardiac medications before your next appointment, please call your pharmacy*  Lab Work: None ordered  If you have labs (blood work) drawn today and your tests are completely normal, you will receive your results only by: MyChart Message (if you have MyChart) OR A paper copy in the mail If you have any lab test that is abnormal or we need to change your treatment, we will call you to review the results.  Testing/Procedures: Your physician has requested that you have an echocardiogram. Echocardiography is a painless test that uses sound waves to create images of your heart. It provides your doctor with information about the size and shape of your heart and how well your heart's chambers and valves are working. This procedure takes approximately one hour. There are no restrictions for this procedure. Please do NOT wear cologne, perfume, aftershave, or lotions (deodorant is allowed). Please arrive 15 minutes prior to your appointment time.  Please note: We ask at that you not bring children with you during ultrasound (echo/ vascular) testing. Due to room size and safety concerns, children are not allowed in the ultrasound rooms during exams. Our front office staff cannot provide observation of children in our lobby area while testing is being conducted. An adult accompanying a patient to their appointment will only be allowed in the ultrasound room at the discretion of the ultrasound technician under special circumstances. We apologize for any inconvenience.  Follow-Up: At [facility], you and your health needs are our priority.  As part of our continuing mission to provide you with exceptional heart care, our providers are all part of one team.  This team includes your primary  Cardiologist (physician) and Advanced Practice Providers or APPs (Physician Assistants and Nurse Practitioners) who all work together to provide you with the care you need, when you need it.  Your next appointment:   BASED UPON YOUR RESULTS  Provider:   Rollo Louder, PA-C          We recommend signing up for the patient portal called MyChart.  Sign up information is provided on this After Visit Summary.  MyChart is used to connect with patients for Virtual Visits (Telemedicine).  Patients are able to view lab/test results, encounter notes, upcoming appointments, etc.  Non-urgent messages can be sent to your provider as well.   To learn more about what you can do with MyChart, go to [email or web address].   Other Instructions

## 2024-10-16 ENCOUNTER — Ambulatory Visit (HOSPITAL_COMMUNITY)
Admission: RE | Admit: 2024-10-16 | Discharge: 2024-10-16 | Disposition: A | Discharge status: Home / Self Care | Source: Ambulatory Visit | Attending: Cardiology | Admitting: Cardiology

## 2024-10-16 ENCOUNTER — Ambulatory Visit: Payer: Self-pay | Admitting: Cardiology

## 2024-10-16 DIAGNOSIS — R9431 Abnormal electrocardiogram [ECG] [EKG]: Secondary | ICD-10-CM | POA: Diagnosis not present

## 2024-10-16 LAB — ECHOCARDIOGRAM COMPLETE
Area-P 1/2: 3.45 cm2
S' Lateral: 2.8 cm
